# Patient Record
Sex: Female | Born: 1955 | Hispanic: Yes | Marital: Married | Smoking: Never smoker
Health system: Southern US, Community
[De-identification: ages and names within clinical notes are randomized; demographics above are authoritative.]

## PROBLEM LIST (undated history)

## (undated) DIAGNOSIS — E785 Hyperlipidemia, unspecified: Secondary | ICD-10-CM

## (undated) DIAGNOSIS — I10 Essential (primary) hypertension: Secondary | ICD-10-CM

## (undated) DIAGNOSIS — E119 Type 2 diabetes mellitus without complications: Secondary | ICD-10-CM

## (undated) HISTORY — DX: Type 2 diabetes mellitus without complications: E11.9

## (undated) HISTORY — PX: EYE SURGERY: SHX253

## (undated) HISTORY — DX: Hyperlipidemia, unspecified: E78.5

## (undated) HISTORY — DX: Essential (primary) hypertension: I10

---

## 2004-03-19 ENCOUNTER — Ambulatory Visit: Payer: Self-pay | Admitting: Family Medicine

## 2004-04-19 ENCOUNTER — Ambulatory Visit: Payer: Self-pay | Admitting: Family Medicine

## 2004-08-17 ENCOUNTER — Ambulatory Visit: Payer: Self-pay | Admitting: Family Medicine

## 2006-12-26 ENCOUNTER — Other Ambulatory Visit: Payer: Self-pay

## 2006-12-26 ENCOUNTER — Ambulatory Visit: Payer: Self-pay | Admitting: Ophthalmology

## 2007-01-02 ENCOUNTER — Ambulatory Visit: Payer: Self-pay | Admitting: Ophthalmology

## 2009-03-14 ENCOUNTER — Emergency Department: Payer: Self-pay | Admitting: Internal Medicine

## 2009-03-29 ENCOUNTER — Ambulatory Visit: Payer: Self-pay | Admitting: Internal Medicine

## 2010-06-26 ENCOUNTER — Emergency Department: Payer: Self-pay | Admitting: Emergency Medicine

## 2013-01-15 ENCOUNTER — Emergency Department: Payer: Self-pay | Admitting: Emergency Medicine

## 2013-01-16 LAB — COMPREHENSIVE METABOLIC PANEL
Albumin: 3.6 g/dL (ref 3.4–5.0)
Alkaline Phosphatase: 157 U/L — ABNORMAL HIGH (ref 50–136)
BUN: 16 mg/dL (ref 7–18)
Calcium, Total: 9.1 mg/dL (ref 8.5–10.1)
Chloride: 94 mmol/L — ABNORMAL LOW (ref 98–107)
Co2: 35 mmol/L — ABNORMAL HIGH (ref 21–32)
Creatinine: 1.02 mg/dL (ref 0.60–1.30)
EGFR (African American): >60
Glucose: 176 mg/dL — ABNORMAL HIGH (ref 65–99)
Osmolality: 277 (ref 275–301)
Potassium: 3.7 mmol/L (ref 3.5–5.1)
SGOT(AST): 36 U/L (ref 15–37)
Total Protein: 8.3 g/dL — ABNORMAL HIGH (ref 6.4–8.2)

## 2013-01-16 LAB — URINALYSIS, COMPLETE
Bilirubin,UR: NEGATIVE
Glucose,UR: 50 mg/dL (ref 0–75)
Nitrite: NEGATIVE
Ph: 8 (ref 4.5–8.0)
RBC,UR: 2 /HPF (ref 0–5)
Specific Gravity: 1.011 (ref 1.003–1.030)
Squamous Epithelial: 16
WBC UR: 45 /HPF (ref 0–5)

## 2013-01-16 LAB — CBC
HGB: 12.4 g/dL (ref 12.0–16.0)
MCHC: 33.1 g/dL (ref 32.0–36.0)
MCV: 85 fL (ref 80–100)
Platelet: 265 10*3/uL (ref 150–440)
RBC: 4.42 10*6/uL (ref 3.80–5.20)
RDW: 13.5 % (ref 11.5–14.5)

## 2014-09-27 ENCOUNTER — Emergency Department
Admit: 2014-09-27 | Disposition: A | Discharge status: Home / Self Care | Payer: Self-pay | Admitting: Emergency Medicine

## 2019-02-09 ENCOUNTER — Other Ambulatory Visit: Payer: Self-pay

## 2019-02-09 ENCOUNTER — Observation Stay
Admission: EM | Admit: 2019-02-09 | Discharge: 2019-02-10 | Disposition: A | Discharge status: Home / Self Care | Payer: Self-pay | Attending: Internal Medicine | Admitting: Internal Medicine

## 2019-02-09 DIAGNOSIS — N183 Chronic kidney disease, stage 3 (moderate): Secondary | ICD-10-CM | POA: Insufficient documentation

## 2019-02-09 DIAGNOSIS — D631 Anemia in chronic kidney disease: Secondary | ICD-10-CM | POA: Insufficient documentation

## 2019-02-09 DIAGNOSIS — Z7982 Long term (current) use of aspirin: Secondary | ICD-10-CM | POA: Insufficient documentation

## 2019-02-09 DIAGNOSIS — Z79899 Other long term (current) drug therapy: Secondary | ICD-10-CM | POA: Insufficient documentation

## 2019-02-09 DIAGNOSIS — R112 Nausea with vomiting, unspecified: Secondary | ICD-10-CM | POA: Diagnosis present

## 2019-02-09 DIAGNOSIS — U071 COVID-19: Secondary | ICD-10-CM | POA: Diagnosis present

## 2019-02-09 DIAGNOSIS — E1122 Type 2 diabetes mellitus with diabetic chronic kidney disease: Secondary | ICD-10-CM | POA: Insufficient documentation

## 2019-02-09 DIAGNOSIS — Z794 Long term (current) use of insulin: Secondary | ICD-10-CM | POA: Insufficient documentation

## 2019-02-09 DIAGNOSIS — D509 Iron deficiency anemia, unspecified: Secondary | ICD-10-CM | POA: Insufficient documentation

## 2019-02-09 DIAGNOSIS — N179 Acute kidney failure, unspecified: Secondary | ICD-10-CM | POA: Insufficient documentation

## 2019-02-09 DIAGNOSIS — I129 Hypertensive chronic kidney disease with stage 1 through stage 4 chronic kidney disease, or unspecified chronic kidney disease: Secondary | ICD-10-CM | POA: Insufficient documentation

## 2019-02-09 DIAGNOSIS — E860 Dehydration: Principal | ICD-10-CM | POA: Insufficient documentation

## 2019-02-09 DIAGNOSIS — G4700 Insomnia, unspecified: Secondary | ICD-10-CM | POA: Insufficient documentation

## 2019-02-09 LAB — URINALYSIS, COMPLETE (UACMP) WITH MICROSCOPIC
Bacteria, UA: NONE SEEN
Bilirubin Urine: NEGATIVE
Glucose, UA: NEGATIVE mg/dL
Hgb urine dipstick: NEGATIVE
Ketones, ur: NEGATIVE mg/dL
Leukocytes,Ua: NEGATIVE
Nitrite: NEGATIVE
Protein, ur: 100 mg/dL — AB
Specific Gravity, Urine: 1.006 (ref 1.005–1.030)
pH: 7 (ref 5.0–8.0)

## 2019-02-09 LAB — CBC
HCT: 33.3 % — ABNORMAL LOW (ref 36.0–46.0)
Hemoglobin: 11 g/dL — ABNORMAL LOW (ref 12.0–15.0)
MCH: 29.6 pg (ref 26.0–34.0)
MCHC: 33 g/dL (ref 30.0–36.0)
MCV: 89.5 fL (ref 80.0–100.0)
Platelets: 219 10*3/uL (ref 150–400)
RBC: 3.72 MIL/uL — ABNORMAL LOW (ref 3.87–5.11)
RDW: 11.9 % (ref 11.5–15.5)
WBC: 6.3 10*3/uL (ref 4.0–10.5)
nRBC: 0 % (ref 0.0–0.2)

## 2019-02-09 LAB — COMPREHENSIVE METABOLIC PANEL
ALT: 13 U/L (ref 0–44)
AST: 26 U/L (ref 15–41)
Albumin: 3.8 g/dL (ref 3.5–5.0)
Alkaline Phosphatase: 91 U/L (ref 38–126)
Anion gap: 10 (ref 5–15)
BUN: 26 mg/dL — ABNORMAL HIGH (ref 8–23)
CO2: 25 mmol/L (ref 22–32)
Calcium: 8.5 mg/dL — ABNORMAL LOW (ref 8.9–10.3)
Chloride: 100 mmol/L (ref 98–111)
Creatinine, Ser: 2.31 mg/dL — ABNORMAL HIGH (ref 0.44–1.00)
GFR calc Af Amer: 26 mL/min — ABNORMAL LOW (ref >60)
GFR calc non Af Amer: 22 mL/min — ABNORMAL LOW (ref >60)
Glucose, Bld: 147 mg/dL — ABNORMAL HIGH (ref 70–99)
Potassium: 4.6 mmol/L (ref 3.5–5.1)
Sodium: 135 mmol/L (ref 135–145)
Total Bilirubin: 0.3 mg/dL (ref 0.3–1.2)
Total Protein: 7.1 g/dL (ref 6.5–8.1)

## 2019-02-09 LAB — SARS CORONAVIRUS 2 (TAT 6-24 HRS): SARS Coronavirus 2: POSITIVE — AB

## 2019-02-09 LAB — GLUCOSE, CAPILLARY: Glucose-Capillary: 99 mg/dL (ref 70–99)

## 2019-02-09 LAB — HEMOGLOBIN A1C
Hgb A1c MFr Bld: 6.2 % — ABNORMAL HIGH (ref 4.8–5.6)
Mean Plasma Glucose: 131.24 mg/dL

## 2019-02-09 LAB — LIPASE, BLOOD: Lipase: 59 U/L — ABNORMAL HIGH (ref 11–51)

## 2019-02-09 MED ORDER — ACETAMINOPHEN 650 MG RE SUPP: 650.0000 mg | Freq: Four times a day (QID) | RECTAL | Status: DC | PRN

## 2019-02-09 MED ORDER — ASPIRIN EC 81 MG PO TBEC
81.0000 mg | DELAYED_RELEASE_TABLET | Freq: Every day | ORAL | Status: DC
  Administered 2019-02-09 – 2019-02-10 (×2): 81 mg via ORAL
  Filled 2019-02-09 (×2): qty 1

## 2019-02-09 MED ORDER — ACETAMINOPHEN 325 MG PO TABS: 650.0000 mg | ORAL_TABLET | Freq: Four times a day (QID) | ORAL | Status: DC | PRN

## 2019-02-09 MED ORDER — ATENOLOL 100 MG PO TABS
100.0000 mg | ORAL_TABLET | Freq: Every day | ORAL | Status: DC
  Administered 2019-02-09 – 2019-02-10 (×2): 100 mg via ORAL
  Filled 2019-02-09 (×3): qty 1

## 2019-02-09 MED ORDER — INSULIN ASPART 100 UNIT/ML ~~LOC~~ SOLN: 0.0000 [IU] | Freq: Every day | SUBCUTANEOUS | Status: DC

## 2019-02-09 MED ORDER — ENOXAPARIN SODIUM 30 MG/0.3ML ~~LOC~~ SOLN
30.0000 mg | SUBCUTANEOUS | Status: DC
  Administered 2019-02-09: 30 mg via SUBCUTANEOUS
  Filled 2019-02-09: qty 0.3

## 2019-02-09 MED ORDER — HYDRALAZINE HCL 20 MG/ML IJ SOLN: 5.0000 mg | INTRAMUSCULAR | Status: DC | PRN

## 2019-02-09 MED ORDER — ONDANSETRON HCL 4 MG PO TABS: 4.0000 mg | ORAL_TABLET | Freq: Four times a day (QID) | ORAL | Status: DC | PRN

## 2019-02-09 MED ORDER — SODIUM CHLORIDE 0.9 % IV BOLUS
1000.0000 mL | Freq: Once | INTRAVENOUS | Status: AC
  Administered 2019-02-09: 1000 mL via INTRAVENOUS

## 2019-02-09 MED ORDER — AMLODIPINE BESYLATE 10 MG PO TABS
10.0000 mg | ORAL_TABLET | Freq: Every day | ORAL | Status: DC
  Administered 2019-02-09 – 2019-02-10 (×2): 10 mg via ORAL
  Filled 2019-02-09 (×2): qty 1

## 2019-02-09 MED ORDER — ONDANSETRON HCL 4 MG/2ML IJ SOLN: 4.0000 mg | Freq: Four times a day (QID) | INTRAMUSCULAR | Status: DC | PRN

## 2019-02-09 MED ORDER — SODIUM CHLORIDE 0.9 % IV SOLN
INTRAVENOUS | Status: DC
  Administered 2019-02-09 – 2019-02-10 (×2): via INTRAVENOUS

## 2019-02-09 MED ORDER — TRAZODONE HCL 50 MG PO TABS
50.0000 mg | ORAL_TABLET | Freq: Every day | ORAL | Status: DC
  Administered 2019-02-09: 50 mg via ORAL
  Filled 2019-02-09: qty 1

## 2019-02-09 MED ORDER — SODIUM CHLORIDE 0.9% FLUSH: 3.0000 mL | Freq: Once | INTRAVENOUS | Status: DC

## 2019-02-09 MED ORDER — POLYETHYLENE GLYCOL 3350 17 G PO PACK: 17.0000 g | PACK | Freq: Every day | ORAL | Status: DC | PRN

## 2019-02-09 MED ORDER — INSULIN ASPART 100 UNIT/ML ~~LOC~~ SOLN
0.0000 [IU] | Freq: Three times a day (TID) | SUBCUTANEOUS | Status: DC
  Filled 2019-02-09: qty 1

## 2019-02-09 MED ORDER — PROMETHAZINE HCL 25 MG/ML IJ SOLN: 12.5000 mg | Freq: Four times a day (QID) | INTRAMUSCULAR | Status: DC | PRN

## 2019-02-09 MED ORDER — HYDRALAZINE HCL 25 MG PO TABS
25.0000 mg | ORAL_TABLET | Freq: Two times a day (BID) | ORAL | Status: DC
  Administered 2019-02-09 – 2019-02-10 (×2): 25 mg via ORAL
  Filled 2019-02-09 (×2): qty 1

## 2019-02-09 MED ORDER — SODIUM CHLORIDE 0.9 % IV SOLN
Freq: Once | INTRAVENOUS | Status: AC
  Administered 2019-02-09: 17:00:00 via INTRAVENOUS

## 2019-02-09 NOTE — ED Notes (Signed)
ED TO INPATIENT HANDOFF REPORT  ED Nurse Name and Phone #: Metta Clines [phone]  S Name/Age/Gender Stephanie Casey 63 y.o. female Room/Bed: ED16A/ED16A  Code Status   Code Status: Not on file  Home/SNF/Other Home Patient oriented to: self, place, time and situation Is this baseline? Yes   Triage Complete: Triage complete  Chief Complaint weak vomiting 3-4 days  Triage Note Pt to ED with c/o of weakness. Pt with hx of low iron and should receive iron infusions every 6 mos but has not had one in over a year. Pt also prescribed "iron medication" but patient ran out and has not taken. Pt also with c/o of NVD for 3 days.    Allergies Allergies  Allergen Reactions  . Hydrochlorothiazide     Pancreatitis    Level of Care/Admitting Diagnosis ED Disposition    ED Disposition Condition [facility] Area: [place] [[id number]]  Level of Care: Med-Surg [16]  Covid Evaluation: Asymptomatic Screening Protocol (No Symptoms)  Diagnosis: Intractable nausea and vomiting [id number]  Admitting Physician: Hyman Bible DODD [id number]  Attending Physician: Hyman Bible DODD [id number]  Estimated length of stay: past midnight tomorrow  Certification:: I certify this patient will need inpatient services for at least 2 midnights  PT Class (Do Not Modify): Inpatient [101]  PT Acc Code (Do Not Modify): Private [1]       B Medical/Surgery History No past medical history on file.    A IV Location/Drains/Wounds Patient Lines/Drains/Airways Status   Active Line/Drains/Airways    Name:   Placement date:   Placement time:   Site:   Days:   Peripheral IV 02/09/19 Left Antecubital   02/09/19    1547    Antecubital   less than 1          Intake/Output Last 24 hours No intake or output data in the 24 hours ending 02/09/19 1718  Labs/Imaging Results for orders placed or performed during the hospital encounter of 02/09/19 (from the past 48 hour(s))  Lipase,  blood     Status: Abnormal   Collection Time: 02/09/19  1:47 PM  Result Value Ref Range   Lipase 59 (H) 11 - 51 U/L    Comment: Performed at [facility], [address] [place], [place] [address]  Comprehensive metabolic panel     Status: Abnormal   Collection Time: 02/09/19  1:47 PM  Result Value Ref Range   Sodium 135 135 - 145 mmol/L   Potassium 4.6 3.5 - 5.1 mmol/L   Chloride 100 98 - 111 mmol/L   CO2 25 22 - 32 mmol/L   Glucose, Bld 147 (H) 70 - 99 mg/dL   BUN 26 (H) 8 - 23 mg/dL   Creatinine, Ser 2.31 (H) 0.44 - 1.00 mg/dL   Calcium 8.5 (L) 8.9 - 10.3 mg/dL   Total Protein 7.1 6.5 - 8.1 g/dL   Albumin 3.8 3.5 - 5.0 g/dL   AST 26 15 - 41 U/L   ALT 13 0 - 44 U/L   Alkaline Phosphatase 91 38 - 126 U/L   Total Bilirubin 0.3 0.3 - 1.2 mg/dL   GFR calc non Af Amer 22 (L) >60 mL/min   GFR calc Af Amer 26 (L) >60 mL/min   Anion gap 10 5 - 15    Comment: Performed at [facility], [address] [place],  [address]  CBC     Status: Abnormal   Collection Time: 02/09/19  1:47 PM  Result Value Ref Range   WBC 6.3 4.0 - 10.5 K/uL   RBC 3.72 (L) 3.87 - 5.11 MIL/uL   Hemoglobin 11.0 (L) 12.0 - 15.0 g/dL   HCT 33.3 (L) 36.0 - 46.0 %   MCV 89.5 80.0 - 100.0 fL   MCH 29.6 26.0 - 34.0 pg   MCHC 33.0 30.0 - 36.0 g/dL   RDW 11.9 11.5 - 15.5 %   Platelets 219 150 - 400 K/uL   nRBC 0.0 0.0 - 0.2 %    Comment: Performed at [facility], [address], [place], [place] [address]   No results found.  Pending Labs Unresulted Labs (From admission, onward)    Start     Ordered   02/09/19 1351  Urinalysis, Complete w Microscopic  ONCE - STAT,   STAT     02/09/19 1350   Signed and Held  HIV antibody (Routine Testing)  Once,   R     Signed and Held   Signed and Occupational hygienist morning,   R     Signed and Held   Signed and Held  CBC  Tomorrow morning,   R     Signed and Held   Signed and Held  Gastrointestinal Panel by PCR ,  Stool  (Gastrointestinal Panel by PCR, Stool)  Once,   R     Signed and Held   Signed and Held  C Difficile Quick Screen w PCR reflex  (Gastrointestinal Panel by PCR, Stool)  Once, for 24 hours,   R     Signed and Held   Signed and Held  Hemoglobin A1c  Once,   R    Comments: To assess prior glycemic control    Signed and Held   Signed and Held  Hemoglobin A1c  Tomorrow morning,   R     Signed and Held          Vitals/Pain Today's Vitals   02/09/19 1630 02/09/19 1700 02/09/19 1709 02/09/19 1715  BP: (!) 166/70 (!) 158/75    Pulse: 73 74    Resp:      Temp:      TempSrc:      SpO2: 99% 96%  98%  PainSc:   0-No pain     Isolation Precautions No active isolations  Medications Medications  sodium chloride flush (NS) 0.9 % injection 3 mL (has no administration in time range)  0.9 %  sodium chloride infusion ( Intravenous New Bag/Given 02/09/19 1713)  sodium chloride 0.9 % bolus 1,000 mL (0 mLs Intravenous Stopped 02/09/19 1702)    Mobility walks with device PRN Low fall risk   Focused Assessments Weakness; NVD x3days; A&Ox4; NSR   R Recommendations: See Admitting Provider Note  Report given to:   Additional Notes:  20g L ac IV

## 2019-02-09 NOTE — H&P (Signed)
[place] [facility] [place] NAME: Stephanie Casey    MR#:  [id number]  DATE OF BIRTH:  [birth year]  DATE OF ADMISSION:  02/09/2019  PRIMARY CARE PHYSICIAN: Stephanie Finner, NP   REQUESTING/REFERRING PHYSICIAN: Duffy Bruce, MD  CHIEF COMPLAINT:   Chief Complaint  Patient presents with  . Weakness    HISTORY OF PRESENT ILLNESS:  Stephanie Casey  is a 63 y.o. female with a known history of CKD III, hypertension, type 2 diabetes, anemia of chronic kidney disease who presented to the ED with nausea, vomiting, and diarrhea over the last 3 days.  Patient states that she has vomited about 3 times a day and had multiple episodes of diarrhea per day.  She has also had associated poor p.o. intake.  She denies any abdominal pain.  No sick contacts.  No fevers or shortness of breath.  She endorses mild chills.  No hematemesis, melena, or hematochezia.  In the ED, she was hypertensive with BP 171/72.  Labs are significant for creatinine 2.31, lipase 59, hemoglobin 11.0.  Hospitalists were called for admission.  PAST MEDICAL HISTORY:  Hypertension Type 2 diabetes CKD III Anemia of chronic kidney disease  PAST SURGICAL HISTORY:  None  SOCIAL HISTORY:   Social History   Tobacco Use  . Smoking status: Not on file  Substance Use Topics  . Alcohol use: Not on file    FAMILY HISTORY:  No family history on file.  DRUG ALLERGIES:   Allergies  Allergen Reactions  . Hydrochlorothiazide     Pancreatitis    REVIEW OF SYSTEMS:   Review of Systems  Constitutional: Positive for chills. Negative for fever.  HENT: Negative for congestion and sore throat.   Eyes: Negative for blurred vision and double vision.  Respiratory: Negative for cough and shortness of breath.   Cardiovascular: Negative for chest pain and palpitations.  Gastrointestinal: Positive for diarrhea, nausea and vomiting. Negative for abdominal pain, blood in stool,  constipation and melena.  Genitourinary: Negative for dysuria and urgency.  Musculoskeletal: Negative for back pain and neck pain.  Neurological: Negative for dizziness and headaches.  Psychiatric/Behavioral: Negative for depression. The patient is not nervous/anxious.     MEDICATIONS AT HOME:   Prior to Admission medications   Medication Sig Start Date End Date Taking? Authorizing Provider  amLODipine (NORVASC) 10 MG tablet Take 10 mg by mouth daily.   Yes [provider]  aspirin EC 81 MG tablet Take 81 mg by mouth daily.   Yes [provider]  atenolol (TENORMIN) 100 MG tablet Take 100 mg by mouth daily.   Yes [provider]  hydrALAZINE (APRESOLINE) 25 MG tablet Take 25 mg by mouth 2 (two) times daily.   Yes [provider]  metFORMIN (GLUCOPHAGE-XR) 500 MG 24 hr tablet Take 500 mg by mouth 2 (two) times daily with a meal.   Yes [provider]  traZODone (DESYREL) 50 MG tablet Take 50-100 mg by mouth at bedtime.   Yes [provider]  insulin NPH Human (NOVOLIN N) 100 UNIT/ML injection Inject 25 Units into the skin 2 (two) times daily.    [provider]      VITAL SIGNS:  Blood pressure (!) 165/71, pulse 69, temperature 99 F (37.2 C), temperature source Oral, resp. rate 16, SpO2 100 %.  PHYSICAL EXAMINATION:  Physical Exam  GENERAL:  63 y.o.-year-old patient lying in the bed with no acute distress.  EYES: Pupils  equal, round, reactive to light and accommodation. No scleral icterus. Extraocular muscles intact.  HEENT: Head atraumatic, normocephalic. Oropharynx and nasopharynx clear.  NECK:  Supple, no jugular venous distention. No thyroid enlargement, no tenderness.  LUNGS: Normal breath sounds bilaterally, no wheezing, rales,rhonchi or crepitation. No use of accessory muscles of respiration.  CARDIOVASCULAR: RRR, S1, S2 normal. No murmurs, rubs, or gallops.  ABDOMEN: Soft, nondistended. Bowel sounds present. No  organomegaly or mass. + Mild tenderness to palpation of the left lower quadrant.  No rebound or guarding.  Murphy sign negative.  No tenderness over McBurney's point. EXTREMITIES: No pedal edema, cyanosis, or clubbing.  NEUROLOGIC: Cranial nerves II through XII are intact. Muscle strength 5/5 in all extremities. Sensation intact. Gait not checked.  PSYCHIATRIC: The patient is alert and oriented x 3.  SKIN: No obvious rash, lesion, or ulcer.   LABORATORY PANEL:   CBC Recent Labs  Lab 02/09/19 1347  WBC 6.3  HGB 11.0*  HCT 33.3*  PLT 219   ------------------------------------------------------------------------------------------------------------------  Chemistries  Recent Labs  Lab 02/09/19 1347  NA 135  K 4.6  CL 100  CO2 25  GLUCOSE 147*  BUN 26*  CREATININE 2.31*  CALCIUM 8.5*  AST 26  ALT 13  ALKPHOS 91  BILITOT 0.3   ------------------------------------------------------------------------------------------------------------------  Cardiac Enzymes No results for input(s): TROPONINI in the last 168 hours. ------------------------------------------------------------------------------------------------------------------  RADIOLOGY:  No results found.    IMPRESSION AND PLAN:   Nausea/vomiting/diarrhea- likely viral gastroenteritis. Lipase is mildly elevated but patient is not having any epigastric abdominal pain. -GI pathogen panel and C. Difficile -IV fluids and IV antiemetics -If no improvement in symptoms, would consider obtaining CT abdomen/pelvis -Start clear liquid diet and advance as tolerated  AKI in CKD III-creatinine 2.31 in the ED.  Most recent outpatient creatinine was 1.46 in 07/2018. -IV fluids -Avoid nephrotoxic agents  Hypertension- BP elevated in the ED -Continue home atenolol, norvasc, hydralazine  Type 2 diabetes- blood sugars elevated in the ED -Sensitive SSI -Check A1c  Insomnia- stable -Continue home trazodone  All the records  are reviewed and case discussed with ED provider. Management plans discussed with the patient, family and they are in agreement.  CODE STATUS: full  TOTAL TIME TAKING CARE OF THIS PATIENT: 45 minutes.    Stephanie Casey M.D on 02/09/2019 at 4:10 PM  Between 7am to 6pm - Pager [phone]  After 6pm go to [email or web address] - Proofreader  [facility]  Office  [phone]  CC: Primary care physician; Stephanie Finner, NP   Note: This dictation was prepared with Dragon dictation along with smaller phrase technology. Any transcriptional errors that result from this process are unintentional.

## 2019-02-09 NOTE — ED Triage Notes (Signed)
Pt to ED with c/o of weakness. Pt with hx of low iron and should receive iron infusions every 6 mos but has not had one in over a year. Pt also prescribed "iron medication" but patient ran out and has not taken. Pt also with c/o of NVD for 3 days.

## 2019-02-09 NOTE — ED Notes (Signed)
Pt resting comfortably in bed

## 2019-02-09 NOTE — ED Notes (Signed)
Pt A&Ox4. C/o weakness x2 weeks; NVD x3 days; family member at bedside; states pain in medial/upper abdomen intermittent. [employer] interpreter system used. Pt agreeable to IV and fluids. EMS student attempting for IV now.

## 2019-02-09 NOTE — ED Notes (Signed)
Request made for aptima/covid test.

## 2019-02-09 NOTE — ED Notes (Addendum)
EMS student attempting for IV at L arm as IV wouldn't thread at R wrist. Pt understands that urine sample is needed and agrees to let this RN know when she can provide a sample.

## 2019-02-09 NOTE — ED Notes (Addendum)
Grn tube sent to lab in case of order for Trop (x2).

## 2019-02-09 NOTE — ED Notes (Signed)
Pt denies nausea.

## 2019-02-09 NOTE — ED Provider Notes (Signed)
[facility] Emergency Department Provider Note  ____________________________________________   First MD Initiated Contact with Patient 02/09/19 1439     (approximate)  I have reviewed the triage vital signs and the nursing notes.   HISTORY  Chief Complaint Weakness    HPI Stephanie Casey is a 63 y.o. female with past medical history of chronic kidney disease here with multiple complaints.  Patient's primary complaint is progressively worsening generalized weakness.  She states that for the last several weeks, she has felt incredibly weak and fatigued.  She has had very poor appetite and believes she is lost several pounds.  She states that she just does not feel like eating.  She states that she has been increasingly weak and lightheaded upon standing.  She is had normal urine output.  No dysuria.  She states that over the last several days, she has had intermittent abdominal cramping and nausea, vomiting, and diarrhea.  No blood in her diarrhea or vomit.  No persistent abdominal pain.  No fevers or chills.  No cough.  No other medical complaints.  Try to call her doctor but was only arranged with a telephone visit, so subsequently presents for evaluation.  She has not had any recent lab work done.        No past medical history on file.   PMHx:  Anemia, iron deficiency CKD  PSHx: noncontributory  Patient Active Problem List   Diagnosis Date Noted  . Intractable nausea and vomiting 02/09/2019     Prior to Admission medications   Medication Sig Start Date End Date Taking? Authorizing Provider  amLODipine (NORVASC) 10 MG tablet Take 10 mg by mouth daily.   Yes [provider]  aspirin EC 81 MG tablet Take 81 mg by mouth daily.   Yes [provider]  atenolol (TENORMIN) 100 MG tablet Take 100 mg by mouth daily.   Yes [provider]  hydrALAZINE (APRESOLINE) 25 MG tablet Take 25 mg by mouth 2 (two) times daily.   Yes  [provider]  metFORMIN (GLUCOPHAGE-XR) 500 MG 24 hr tablet Take 500 mg by mouth 2 (two) times daily with a meal.   Yes [provider]  traZODone (DESYREL) 50 MG tablet Take 50-100 mg by mouth at bedtime.   Yes [provider]  insulin NPH Human (NOVOLIN N) 100 UNIT/ML injection Inject 25 Units into the skin 2 (two) times daily.    [provider]    Allergies Hydrochlorothiazide  No family history on file.  Social History Social History   Tobacco Use  . Smoking status: Not on file  Substance Use Topics  . Alcohol use: Not on file  . Drug use: Not on file    Review of Systems  Review of Systems  Constitutional: Positive for fatigue. Negative for fever.  HENT: Negative for congestion and sore throat.   Eyes: Negative for visual disturbance.  Respiratory: Negative for cough and shortness of breath.   Cardiovascular: Negative for chest pain.  Gastrointestinal: Positive for diarrhea, nausea and vomiting. Negative for abdominal pain.  Genitourinary: Negative for flank pain.  Musculoskeletal: Negative for back pain and neck pain.  Skin: Negative for rash and wound.  Neurological: Positive for weakness.  All other systems reviewed and are negative.    ____________________________________________  PHYSICAL EXAM:      VITAL SIGNS: ED Triage Vitals  Enc Vitals Group     BP 02/09/19 1338 (!) 132/58     Pulse  Rate 02/09/19 1338 67     Resp 02/09/19 1338 16     Temp 02/09/19 1338 99 F (37.2 C)     Temp Source 02/09/19 1338 Oral     SpO2 02/09/19 1338 97 %     Weight --      Height --      Head Circumference --      Peak Flow --      Pain Score 02/09/19 1342 0     Pain Loc --      Pain Edu? --      Excl. in [place]? --      Physical Exam Vitals signs and nursing note reviewed.  Constitutional:      General: She is not in acute distress.    Appearance: She is well-developed.  HENT:     Head: Normocephalic and atraumatic.      Mouth/Throat:     Mouth: Mucous membranes are dry.  Eyes:     Conjunctiva/sclera: Conjunctivae normal.  Neck:     Musculoskeletal: Neck supple.  Cardiovascular:     Rate and Rhythm: Normal rate and regular rhythm.     Heart sounds: Normal heart sounds. No murmur. No friction rub.  Pulmonary:     Effort: Pulmonary effort is normal. No respiratory distress.     Breath sounds: Normal breath sounds. No wheezing or rales.  Abdominal:     General: There is no distension.     Palpations: Abdomen is soft.     Tenderness: There is no abdominal tenderness.  Skin:    General: Skin is warm.     Capillary Refill: Capillary refill takes less than 2 seconds.  Neurological:     Mental Status: She is alert and oriented to person, place, and time.     Motor: No abnormal muscle tone.       ____________________________________________   LABS (all labs ordered are listed, but only abnormal results are displayed)  Labs Reviewed  LIPASE, BLOOD - Abnormal; Notable for the following components:      Result Value   Lipase 59 (*)    All other components within normal limits  COMPREHENSIVE METABOLIC PANEL - Abnormal; Notable for the following components:   Glucose, Bld 147 (*)    BUN 26 (*)    Creatinine, Ser 2.31 (*)    Calcium 8.5 (*)    GFR calc non Af Amer 22 (*)    GFR calc Af Amer 26 (*)    All other components within normal limits  CBC - Abnormal; Notable for the following components:   RBC 3.72 (*)    Hemoglobin 11.0 (*)    HCT 33.3 (*)    All other components within normal limits  URINALYSIS, COMPLETE (UACMP) WITH MICROSCOPIC - Abnormal; Notable for the following components:   Color, Urine STRAW (*)    APPearance CLEAR (*)    Protein, ur 100 (*)    All other components within normal limits  SARS CORONAVIRUS 2    ____________________________________________  EKG: None ________________________________________  RADIOLOGY All imaging, including plain films, CT scans, and  ultrasounds, independently reviewed by me, and interpretations confirmed via formal radiology reads.  ED MD interpretation:   None  Official radiology report(s): No results found.  ____________________________________________  PROCEDURES   Procedure(s) performed (including Critical Care):  Procedures  ____________________________________________  INITIAL IMPRESSION / MDM / [place] / ED COURSE  As part of my medical decision making, I reviewed the following data within the  electronic MEDICAL RECORD NUMBER Notes from prior ED visits and [place] Controlled Substance Database      *Stephanie Casey was evaluated in Emergency Department on 02/09/2019 for the symptoms described in the history of present illness. She was evaluated in the context of the global COVID-19 pandemic, which necessitated consideration that the patient might be at risk for infection with the SARS-CoV-2 virus that causes COVID-19. Institutional protocols and algorithms that pertain to the evaluation of patients at risk for COVID-19 are in a state of rapid change based on information released by regulatory bodies including the CDC and federal and state organizations. These policies and algorithms were followed during the patient's care in the ED.  Some ED evaluations and interventions may be delayed as a result of limited staffing during the pandemic.*      Medical Decision Making: 63 year old female here with acute on chronic kidney injury likely due to significant dehydration.  Patient also with baseline chronic anemia.  Urinalysis unremarkable.  She has some nausea, vomiting, diarrhea, which could be secondary to viral illness, gastroenteritis, malnutrition, but do not suspect acute intra-abdominal emergency with absence of any abdominal tenderness on serial exams.  Do not feel CT imaging would be beneficial at this time, especially in the absence of contrast given her kidney injury.  Will plan to admit for  gentle hydration and further monitoring.  ____________________________________________  FINAL CLINICAL IMPRESSION(S) / ED DIAGNOSES  Final diagnoses:  AKI (acute kidney injury) ([place])  Dehydration     MEDICATIONS GIVEN DURING THIS VISIT:  Medications  sodium chloride flush (NS) 0.9 % injection 3 mL (has no administration in time range)  0.9 %  sodium chloride infusion ( Intravenous New Bag/Given 02/09/19 1713)  sodium chloride 0.9 % bolus 1,000 mL (0 mLs Intravenous Stopped 02/09/19 1702)     ED Discharge Orders    None       Note:  This document was prepared using Dragon voice recognition software and may include unintentional dictation errors.   Duffy Bruce, MD 02/09/19 [phone]

## 2019-02-10 ENCOUNTER — Other Ambulatory Visit: Payer: Self-pay

## 2019-02-10 DIAGNOSIS — U071 COVID-19: Secondary | ICD-10-CM | POA: Diagnosis present

## 2019-02-10 LAB — CBC
HCT: 31.4 % — ABNORMAL LOW (ref 36.0–46.0)
Hemoglobin: 10.4 g/dL — ABNORMAL LOW (ref 12.0–15.0)
MCH: 29.8 pg (ref 26.0–34.0)
MCHC: 33.1 g/dL (ref 30.0–36.0)
MCV: 90 fL (ref 80.0–100.0)
Platelets: 186 10*3/uL (ref 150–400)
RBC: 3.49 MIL/uL — ABNORMAL LOW (ref 3.87–5.11)
RDW: 12.1 % (ref 11.5–15.5)
WBC: 5.2 10*3/uL (ref 4.0–10.5)
nRBC: 0 % (ref 0.0–0.2)

## 2019-02-10 LAB — BASIC METABOLIC PANEL
Anion gap: 10 (ref 5–15)
BUN: 20 mg/dL (ref 8–23)
CO2: 23 mmol/L (ref 22–32)
Calcium: 8.3 mg/dL — ABNORMAL LOW (ref 8.9–10.3)
Chloride: 107 mmol/L (ref 98–111)
Creatinine, Ser: 1.7 mg/dL — ABNORMAL HIGH (ref 0.44–1.00)
GFR calc Af Amer: 37 mL/min — ABNORMAL LOW (ref >60)
GFR calc non Af Amer: 32 mL/min — ABNORMAL LOW (ref >60)
Glucose, Bld: 112 mg/dL — ABNORMAL HIGH (ref 70–99)
Potassium: 4.1 mmol/L (ref 3.5–5.1)
Sodium: 140 mmol/L (ref 135–145)

## 2019-02-10 LAB — GLUCOSE, CAPILLARY: Glucose-Capillary: 78 mg/dL (ref 70–99)

## 2019-02-10 NOTE — Progress Notes (Signed)
The patient is transferred to room 235 . Patient is settled in her room by the help of interpreter  Sharyn Lull  with [phone]. Patient denied any acute pain. Will continue to monitor.

## 2019-02-10 NOTE — Progress Notes (Signed)
Discharge instructions provided to pt.  All questions addressed.  Understanding verified through teach back.  Awaiting transportation home via POV.  

## 2019-02-10 NOTE — Discharge Summary (Signed)
[facility] [place] [facility] [place], [place] y.o., DOB 1955-10-02, MRN [id number]. Admission date: 02/09/2019 Discharge Date 02/10/2019 Primary MD Freddy Finner, NP Admitting Physician Sela Hua, MD  Admission Diagnosis  Dehydration [E86.0] AKI (acute kidney injury) [facility]) [N17.9]  Discharge Diagnosis   Active Problems: Intractable nausea and vomiting COVID-19 Kidney injury on chronic kidney disease stage III Essential hypertension Diabetes type [place]  is a 63 y.o. female with a known history of CKD III, hypertension, type 2 diabetes, anemia of chronic kidney disease who presented to the ED with nausea, vomiting, and diarrhea over the last 3 days.  Patient states that she has vomited about 3 times a day and had multiple episodes of diarrhea per day.  Patient was noted to have positive COVID-19.            Consults  None  Significant Tests:  See full reports for all details     No results found.     Today   Subjective:   Stephanie Casey patient's feeling better nausea vomiting is resolved  Objective:   Blood pressure (!) 150/65, pulse 70, temperature 99.2 F (37.3 C), temperature source Oral, resp. rate 16, height 5\' 5"  (1.651 m), weight 50.4 kg, SpO2 96 %.  .  Intake/Output Summary (Last 24 hours) at 02/10/2019 1557 Last data filed at 02/10/2019 0700 Gross per 24 hour  Intake 280 ml  Output -  Net 280 ml    Exam VITAL SIGNS: Blood pressure (!) 150/65, pulse 70, temperature 99.2 F (37.3 C), temperature source Oral, resp. rate 16, height 5\' 5"  (1.651 m), weight 50.4 kg, SpO2 96 %.  GENERAL:  63 y.o.-year-old patient lying in the bed with no acute distress.  EYES: Pupils equal, round, reactive to light and accommodation. No scleral icterus. Extraocular muscles intact.  HEENT: Head atraumatic, normocephalic. Oropharynx and nasopharynx clear.  NECK:  Supple, no jugular venous  distention. No thyroid enlargement, no tenderness.  LUNGS: Normal breath sounds bilaterally, no wheezing, rales,rhonchi or crepitation. No use of accessory muscles of respiration.  CARDIOVASCULAR: S1, S2 normal. No murmurs, rubs, or gallops.  ABDOMEN: Soft, nontender, nondistended. Bowel sounds present. No organomegaly or mass.  EXTREMITIES: No pedal edema, cyanosis, or clubbing.  NEUROLOGIC: Cranial nerves II through XII are intact. Muscle strength 5/5 in all extremities. Sensation intact. Gait not checked.  PSYCHIATRIC: The patient is alert and oriented x 3.  SKIN: No obvious rash, lesion, or ulcer.   Data Review     CBC w Diff:  Lab Results  Component Value Date   WBC 5.2 02/10/2019   HGB 10.4 (L) 02/10/2019   HGB 12.4 01/15/2013   HCT 31.4 (L) 02/10/2019   HCT 37.6 01/15/2013   PLT 186 02/10/2019   PLT 265 01/15/2013   CMP:  Lab Results  Component Value Date   NA 140 02/10/2019   NA 136 01/15/2013   K 4.1 02/10/2019   K 3.7 01/15/2013   CL 107 02/10/2019   CL 94 (L) 01/15/2013   CO2 23 02/10/2019   CO2 35 (H) 01/15/2013   BUN 20 02/10/2019   BUN 16 01/15/2013   CREATININE 1.70 (H) 02/10/2019   CREATININE 1.02 01/15/2013   PROT 7.1 02/09/2019   PROT 8.3 (H) 01/15/2013   ALBUMIN 3.8 02/09/2019   ALBUMIN 3.6 01/15/2013   BILITOT 0.3 02/09/2019   BILITOT 0.4 01/15/2013   ALKPHOS 91  02/09/2019   ALKPHOS 157 (H) 01/15/2013   AST 26 02/09/2019   AST 36 01/15/2013   ALT 13 02/09/2019   ALT 29 01/15/2013  .  Micro Results Recent Results (from the past 240 hour(s))  SARS CORONAVIRUS 2 Nasal Swab Aptima Multi Swab     Status: Abnormal   Collection Time: 02/09/19  7:24 PM   Specimen: Aptima Multi Swab; Nasal Swab  Result Value Ref Range Status   SARS Coronavirus 2 POSITIVE (A) NEGATIVE Final    Comment: (NOTE) SARS-CoV-2 target nucleic acids are DETECTED. The SARS-CoV-2 RNA is generally detectable in upper and lower respiratory specimens during the acute phase  of infection. Positive results are indicative of active infection with SARS-CoV-2. Clinical  correlation with patient history and other diagnostic information is necessary to determine patient infection status. Positive results do  not rule out bacterial infection or co-infection with other viruses. The expected result is Negative. Fact Sheet for Patients: SugarRoll.be Fact Sheet for Healthcare Providers: https://www.woods-mathews.com/ This test is not yet approved or cleared by the [place] FDA and  has been authorized for detection and/or diagnosis of SARS-CoV-2 by FDA under an Emergency Use Authorization (EUA). This EUA will remain  in effect (meaning this test can be used) for the duration of the COVID-19 declaration under Section 564(b)(1) of the Act, 21 U.S.C.  section 360bbb-3(b)(1), unless the authorization is terminated or revoked sooner. Performed at [facility], [place] [address] [place], [place] [address]         Code Status Orders  (From admission, onward)         Start     Ordered   02/09/19 1836  Full code  Continuous     02/09/19 1835        Code Status History    This patient has a current code status but no historical code status.   Advance Care Planning Activity          Follow-up Information    Freddy Finner, NP. Go on 02/18/2019.   Specialty: Nurse Practitioner Why: virtual appointment at 10:40am Contact information: [place] [address] [phone]           Discharge Medications   Allergies as of 02/10/2019      Reactions   Hydrochlorothiazide    Pancreatitis      Medication List    STOP taking these medications   metFORMIN 500 MG 24 hr tablet Commonly known as: GLUCOPHAGE-XR     TAKE these medications   amLODipine 10 MG tablet Commonly known as: NORVASC Take 10 mg by mouth daily.   aspirin EC 81 MG tablet Take 81 mg by mouth daily.    atenolol 100 MG tablet Commonly known as: TENORMIN Take 100 mg by mouth daily.   hydrALAZINE 25 MG tablet Commonly known as: APRESOLINE Take 25 mg by mouth 2 (two) times daily.   insulin NPH Human 100 UNIT/ML injection Commonly known as: NOVOLIN N Inject 25 Units into the skin 2 (two) times daily.   traZODone 50 MG tablet Commonly known as: DESYREL Take 50-100 mg by mouth at bedtime.          Total Time in preparing paper work, data evaluation and todays exam - 52 minutes  Dustin Flock M.D on 02/10/2019 at 3:57 PM [place]  [phone]

## 2019-02-10 NOTE — Plan of Care (Signed)
Pt ready for discharge home.   Problem: Education: Goal: Knowledge of General Education information will improve Description: Including pain rating scale, medication(s)/side effects and non-pharmacologic comfort measures Outcome: Completed/Met   Problem: Health Behavior/Discharge Planning: Goal: Ability to manage health-related needs will improve Outcome: Completed/Met   Problem: Clinical Measurements: Goal: Ability to maintain clinical measurements within normal limits will improve Outcome: Completed/Met Goal: Will remain free from infection Outcome: Completed/Met Goal: Diagnostic test results will improve Outcome: Completed/Met Goal: Respiratory complications will improve Outcome: Completed/Met Goal: Cardiovascular complication will be avoided Outcome: Completed/Met   Problem: Activity: Goal: Risk for activity intolerance will decrease Outcome: Completed/Met   Problem: Nutrition: Goal: Adequate nutrition will be maintained Outcome: Completed/Met   Problem: Coping: Goal: Level of anxiety will decrease Outcome: Completed/Met   Problem: Elimination: Goal: Will not experience complications related to bowel motility Outcome: Completed/Met Goal: Will not experience complications related to urinary retention Outcome: Completed/Met   Problem: Pain Managment: Goal: General experience of comfort will improve Outcome: Completed/Met   Problem: Safety: Goal: Ability to remain free from injury will improve Outcome: Completed/Met   Problem: Skin Integrity: Goal: Risk for impaired skin integrity will decrease Outcome: Completed/Met   Problem: Inadequate Intake (NI-2.1) Goal: Food and/or nutrient delivery Description: Individualized approach for food/nutrient provision. Outcome: Completed/Met

## 2019-02-10 NOTE — Progress Notes (Signed)
Initial Nutrition Assessment  DOCUMENTATION CODES:   Underweight  INTERVENTION:   Ensure Enlive po BID, each supplement provides 350 kcal and 20 grams of protein  MVI daily   Recommend liberal diet   NUTRITION DIAGNOSIS:   Inadequate oral intake related to acute illness as evidenced by other (comment)(per chart review).  GOAL:   Patient will meet greater than or equal to 90% of their needs  MONITOR:   PO intake, Supplement acceptance, Labs, Weight trends, Skin, I & O's  REASON FOR ASSESSMENT:   Malnutrition Screening Tool    ASSESSMENT:   63 y.o. female with a known history of CKD III, hypertension, type 2 diabetes, anemia of chronic kidney disease admitted with COVID 19  RD working remotely.  Pt with poor appetite and oral intake for 3 days pta r/t nausea and vomiting. Pt advanced to a regular diet today. RD will add supplements and MVI to help pt meet her estimated needs. There is not a very detailed weight history in chart to confirm any significant weight loss but pt appears to be down 9lbs(7%) since February.   Medications reviewed and include: aspirin, lovenox, insulin, NaCl @100ml /hr  Labs reviewed: creat 1.70(H) Hgb 10.4(L), Hct 31.4(L)  Unable to complete Nutrition-Focused physical exam at this time.   Diet Order:   Diet Order            Diet - low sodium heart healthy        Diet clear liquid Room service appropriate? Yes; Fluid consistency: Thin  Diet effective now             EDUCATION NEEDS:   No education needs have been identified at this time  Skin:  Skin Assessment: Reviewed RN Assessment  Last BM:  8/23  Height:   Ht Readings from Last 1 Encounters:  02/09/19 5\' 5"  (1.651 m)    Weight:   Wt Readings from Last 1 Encounters:  02/09/19 50.4 kg    Ideal Body Weight:  56.8 kg  BMI:  Body mass index is 18.49 kg/m.  Estimated Nutritional Needs:   Kcal:  1400-1600kcal/day  Protein:  70-80g/day  Fluid:   >1.4L/day  Koleen Distance MS, RD, LDN Pager #- [phone] Office#- [phone] After Hours Pager: [phone]

## 2019-02-11 LAB — HIV ANTIBODY (ROUTINE TESTING W REFLEX): HIV Screen 4th Generation wRfx: NONREACTIVE

## 2020-02-26 ENCOUNTER — Encounter: Payer: Self-pay | Admitting: *Deleted

## 2020-02-26 ENCOUNTER — Emergency Department
Admission: EM | Admit: 2020-02-26 | Discharge: 2020-02-26 | Disposition: A | Discharge status: Home / Self Care | Payer: No Typology Code available for payment source | Attending: Emergency Medicine | Admitting: Emergency Medicine

## 2020-02-26 ENCOUNTER — Other Ambulatory Visit: Payer: Self-pay

## 2020-02-26 ENCOUNTER — Emergency Department: Payer: No Typology Code available for payment source

## 2020-02-26 DIAGNOSIS — Z79899 Other long term (current) drug therapy: Secondary | ICD-10-CM | POA: Diagnosis not present

## 2020-02-26 DIAGNOSIS — E119 Type 2 diabetes mellitus without complications: Secondary | ICD-10-CM | POA: Insufficient documentation

## 2020-02-26 DIAGNOSIS — M542 Cervicalgia: Secondary | ICD-10-CM | POA: Diagnosis not present

## 2020-02-26 DIAGNOSIS — Z794 Long term (current) use of insulin: Secondary | ICD-10-CM | POA: Insufficient documentation

## 2020-02-26 DIAGNOSIS — I10 Essential (primary) hypertension: Secondary | ICD-10-CM | POA: Insufficient documentation

## 2020-02-26 MED ORDER — ONDANSETRON 4 MG PO TBDP: 4.0000 mg | ORAL_TABLET | Freq: Three times a day (TID) | ORAL | 0 refills | Status: AC | PRN

## 2020-02-26 MED ORDER — ONDANSETRON 4 MG PO TBDP
4.0000 mg | ORAL_TABLET | Freq: Once | ORAL | Status: AC
  Administered 2020-02-26: 4 mg via ORAL
  Filled 2020-02-26: qty 1

## 2020-02-26 MED ORDER — HYDROCODONE-ACETAMINOPHEN 5-325 MG PO TABS
1.0000 | ORAL_TABLET | Freq: Once | ORAL | Status: AC
  Administered 2020-02-26: 1 via ORAL
  Filled 2020-02-26: qty 1

## 2020-02-26 MED ORDER — TRAMADOL HCL 50 MG PO TABS: 50.0000 mg | ORAL_TABLET | Freq: Four times a day (QID) | ORAL | 0 refills | Status: AC | PRN
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
## 2020-02-26 NOTE — ED Notes (Signed)
Via video interpreter Rudell Cobb [phone]

## 2020-06-21 ENCOUNTER — Other Ambulatory Visit: Payer: Self-pay

## 2020-06-21 ENCOUNTER — Inpatient Hospital Stay
Admission: EM | Admit: 2020-06-21 | Discharge: 2020-07-20 | DRG: 291 | Disposition: E | Payer: Medicaid Other | Attending: Internal Medicine | Admitting: Internal Medicine

## 2020-06-21 ENCOUNTER — Inpatient Hospital Stay: Payer: Medicaid Other

## 2020-06-21 ENCOUNTER — Encounter: Payer: Self-pay | Admitting: Emergency Medicine

## 2020-06-21 ENCOUNTER — Emergency Department: Payer: Medicaid Other

## 2020-06-21 DIAGNOSIS — G4733 Obstructive sleep apnea (adult) (pediatric): Secondary | ICD-10-CM | POA: Diagnosis present

## 2020-06-21 DIAGNOSIS — J9601 Acute respiratory failure with hypoxia: Secondary | ICD-10-CM | POA: Diagnosis present

## 2020-06-21 DIAGNOSIS — Z888 Allergy status to other drugs, medicaments and biological substances status: Secondary | ICD-10-CM

## 2020-06-21 DIAGNOSIS — G928 Other toxic encephalopathy: Secondary | ICD-10-CM | POA: Diagnosis not present

## 2020-06-21 DIAGNOSIS — Z4659 Encounter for fitting and adjustment of other gastrointestinal appliance and device: Secondary | ICD-10-CM

## 2020-06-21 DIAGNOSIS — Z01818 Encounter for other preprocedural examination: Secondary | ICD-10-CM

## 2020-06-21 DIAGNOSIS — J9602 Acute respiratory failure with hypercapnia: Secondary | ICD-10-CM | POA: Diagnosis present

## 2020-06-21 DIAGNOSIS — E785 Hyperlipidemia, unspecified: Secondary | ICD-10-CM | POA: Diagnosis present

## 2020-06-21 DIAGNOSIS — Z794 Long term (current) use of insulin: Secondary | ICD-10-CM

## 2020-06-21 DIAGNOSIS — R0602 Shortness of breath: Secondary | ICD-10-CM | POA: Diagnosis present

## 2020-06-21 DIAGNOSIS — N179 Acute kidney failure, unspecified: Secondary | ICD-10-CM | POA: Diagnosis present

## 2020-06-21 DIAGNOSIS — Z20822 Contact with and (suspected) exposure to covid-19: Secondary | ICD-10-CM | POA: Diagnosis present

## 2020-06-21 DIAGNOSIS — Z79899 Other long term (current) drug therapy: Secondary | ICD-10-CM | POA: Diagnosis not present

## 2020-06-21 DIAGNOSIS — E1122 Type 2 diabetes mellitus with diabetic chronic kidney disease: Secondary | ICD-10-CM | POA: Diagnosis present

## 2020-06-21 DIAGNOSIS — Z8616 Personal history of COVID-19: Secondary | ICD-10-CM

## 2020-06-21 DIAGNOSIS — I132 Hypertensive heart and chronic kidney disease with heart failure and with stage 5 chronic kidney disease, or end stage renal disease: Secondary | ICD-10-CM | POA: Diagnosis present

## 2020-06-21 DIAGNOSIS — I5043 Acute on chronic combined systolic (congestive) and diastolic (congestive) heart failure: Secondary | ICD-10-CM | POA: Diagnosis present

## 2020-06-21 DIAGNOSIS — I5031 Acute diastolic (congestive) heart failure: Secondary | ICD-10-CM | POA: Diagnosis present

## 2020-06-21 DIAGNOSIS — Z0189 Encounter for other specified special examinations: Secondary | ICD-10-CM

## 2020-06-21 DIAGNOSIS — N051 Unspecified nephritic syndrome with focal and segmental glomerular lesions: Secondary | ICD-10-CM

## 2020-06-21 DIAGNOSIS — R7989 Other specified abnormal findings of blood chemistry: Secondary | ICD-10-CM

## 2020-06-21 DIAGNOSIS — I959 Hypotension, unspecified: Secondary | ICD-10-CM | POA: Diagnosis present

## 2020-06-21 DIAGNOSIS — I4891 Unspecified atrial fibrillation: Secondary | ICD-10-CM | POA: Diagnosis present

## 2020-06-21 DIAGNOSIS — I120 Hypertensive chronic kidney disease with stage 5 chronic kidney disease or end stage renal disease: Secondary | ICD-10-CM | POA: Diagnosis present

## 2020-06-21 DIAGNOSIS — N041 Nephrotic syndrome with focal and segmental glomerular lesions: Secondary | ICD-10-CM | POA: Diagnosis present

## 2020-06-21 DIAGNOSIS — Z66 Do not resuscitate: Secondary | ICD-10-CM | POA: Diagnosis not present

## 2020-06-21 DIAGNOSIS — J811 Chronic pulmonary edema: Secondary | ICD-10-CM

## 2020-06-21 DIAGNOSIS — N186 End stage renal disease: Secondary | ICD-10-CM | POA: Diagnosis present

## 2020-06-21 DIAGNOSIS — J9600 Acute respiratory failure, unspecified whether with hypoxia or hypercapnia: Secondary | ICD-10-CM

## 2020-06-21 DIAGNOSIS — D631 Anemia in chronic kidney disease: Secondary | ICD-10-CM | POA: Diagnosis present

## 2020-06-21 DIAGNOSIS — E119 Type 2 diabetes mellitus without complications: Secondary | ICD-10-CM

## 2020-06-21 DIAGNOSIS — I272 Pulmonary hypertension, unspecified: Secondary | ICD-10-CM | POA: Diagnosis present

## 2020-06-21 DIAGNOSIS — Z7982 Long term (current) use of aspirin: Secondary | ICD-10-CM

## 2020-06-21 DIAGNOSIS — R778 Other specified abnormalities of plasma proteins: Secondary | ICD-10-CM

## 2020-06-21 DIAGNOSIS — I255 Ischemic cardiomyopathy: Secondary | ICD-10-CM | POA: Diagnosis present

## 2020-06-21 DIAGNOSIS — I509 Heart failure, unspecified: Secondary | ICD-10-CM

## 2020-06-21 DIAGNOSIS — I10 Essential (primary) hypertension: Secondary | ICD-10-CM

## 2020-06-21 DIAGNOSIS — I248 Other forms of acute ischemic heart disease: Secondary | ICD-10-CM | POA: Diagnosis present

## 2020-06-21 DIAGNOSIS — Z978 Presence of other specified devices: Secondary | ICD-10-CM

## 2020-06-21 LAB — CREATININE, URINE, RANDOM: Creatinine, Urine: 76 mg/dL

## 2020-06-21 LAB — URINALYSIS, COMPLETE (UACMP) WITH MICROSCOPIC
Bilirubin Urine: NEGATIVE
Glucose, UA: NEGATIVE mg/dL
Hgb urine dipstick: NEGATIVE
Ketones, ur: NEGATIVE mg/dL
Leukocytes,Ua: NEGATIVE
Nitrite: NEGATIVE
Protein, ur: >=300 mg/dL — AB
Specific Gravity, Urine: 1.01 (ref 1.005–1.030)
pH: 5 (ref 5.0–8.0)

## 2020-06-21 LAB — BASIC METABOLIC PANEL
Anion gap: 11 (ref 5–15)
BUN: 78 mg/dL — ABNORMAL HIGH (ref 8–23)
CO2: 23 mmol/L (ref 22–32)
Calcium: 8.4 mg/dL — ABNORMAL LOW (ref 8.9–10.3)
Chloride: 99 mmol/L (ref 98–111)
Creatinine, Ser: 6.46 mg/dL — ABNORMAL HIGH (ref 0.44–1.00)
GFR, Estimated: 7 mL/min — ABNORMAL LOW (ref >60)
Glucose, Bld: 146 mg/dL — ABNORMAL HIGH (ref 70–99)
Potassium: 4.9 mmol/L (ref 3.5–5.1)
Sodium: 133 mmol/L — ABNORMAL LOW (ref 135–145)

## 2020-06-21 LAB — BRAIN NATRIURETIC PEPTIDE: B Natriuretic Peptide: 4366.4 pg/mL — ABNORMAL HIGH (ref 0.0–100.0)

## 2020-06-21 LAB — CBC
HCT: 29.8 % — ABNORMAL LOW (ref 36.0–46.0)
Hemoglobin: 9.9 g/dL — ABNORMAL LOW (ref 12.0–15.0)
MCH: 31 pg (ref 26.0–34.0)
MCHC: 33.2 g/dL (ref 30.0–36.0)
MCV: 93.4 fL (ref 80.0–100.0)
Platelets: 373 10*3/uL (ref 150–400)
RBC: 3.19 MIL/uL — ABNORMAL LOW (ref 3.87–5.11)
RDW: 13.6 % (ref 11.5–15.5)
WBC: 10.1 10*3/uL (ref 4.0–10.5)
nRBC: 0 % (ref 0.0–0.2)

## 2020-06-21 LAB — HEPATIC FUNCTION PANEL
ALT: 38 U/L (ref 0–44)
AST: 31 U/L (ref 15–41)
Albumin: 3.3 g/dL — ABNORMAL LOW (ref 3.5–5.0)
Alkaline Phosphatase: 121 U/L (ref 38–126)
Bilirubin, Direct: <0.1 mg/dL (ref 0.0–0.2)
Total Bilirubin: 0.8 mg/dL (ref 0.3–1.2)
Total Protein: 6.9 g/dL (ref 6.5–8.1)

## 2020-06-21 LAB — LACTIC ACID, PLASMA: Lactic Acid, Venous: 0.9 mmol/L (ref 0.5–1.9)

## 2020-06-21 LAB — TRIGLYCERIDES: Triglycerides: 50 mg/dL (ref <150)

## 2020-06-21 LAB — TROPONIN I (HIGH SENSITIVITY)
Troponin I (High Sensitivity): 46 ng/L — ABNORMAL HIGH (ref <18)
Troponin I (High Sensitivity): 54 ng/L — ABNORMAL HIGH (ref <18)

## 2020-06-21 LAB — RESP PANEL BY RT-PCR (FLU A&B, COVID) ARPGX2
Influenza A by PCR: NEGATIVE
Influenza A by PCR: NEGATIVE
Influenza B by PCR: NEGATIVE
Influenza B by PCR: NEGATIVE
SARS Coronavirus 2 by RT PCR: NEGATIVE
SARS Coronavirus 2 by RT PCR: NEGATIVE

## 2020-06-21 LAB — FERRITIN: Ferritin: 183 ng/mL (ref 11–307)

## 2020-06-21 LAB — PROCALCITONIN: Procalcitonin: <0.1 ng/mL

## 2020-06-21 LAB — URINE DRUG SCREEN, QUALITATIVE (ARMC ONLY)
Amphetamines, Ur Screen: NOT DETECTED
Barbiturates, Ur Screen: NOT DETECTED
Benzodiazepine, Ur Scrn: NOT DETECTED
Cannabinoid 50 Ng, Ur ~~LOC~~: NOT DETECTED
Cocaine Metabolite,Ur ~~LOC~~: NOT DETECTED
MDMA (Ecstasy)Ur Screen: NOT DETECTED
Methadone Scn, Ur: NOT DETECTED
Opiate, Ur Screen: NOT DETECTED
Phencyclidine (PCP) Ur S: NOT DETECTED
Tricyclic, Ur Screen: NOT DETECTED

## 2020-06-21 LAB — CBG MONITORING, ED
Glucose-Capillary: 148 mg/dL — ABNORMAL HIGH (ref 70–99)
Glucose-Capillary: 182 mg/dL — ABNORMAL HIGH (ref 70–99)

## 2020-06-21 LAB — SODIUM, URINE, RANDOM: Sodium, Ur: 33 mmol/L

## 2020-06-21 LAB — MAGNESIUM: Magnesium: 3 mg/dL — ABNORMAL HIGH (ref 1.7–2.4)

## 2020-06-21 LAB — C-REACTIVE PROTEIN: CRP: 0.8 mg/dL (ref <1.0)

## 2020-06-21 LAB — FIBRINOGEN: Fibrinogen: 539 mg/dL — ABNORMAL HIGH (ref 210–475)

## 2020-06-21 LAB — LACTATE DEHYDROGENASE: LDH: 213 U/L — ABNORMAL HIGH (ref 98–192)

## 2020-06-21 LAB — FIBRIN DERIVATIVES D-DIMER (ARMC ONLY): Fibrin derivatives D-dimer (ARMC): 1232.8 ng/mL (FEU) — ABNORMAL HIGH (ref 0.00–499.00)

## 2020-06-21 MED ORDER — FUROSEMIDE 10 MG/ML IJ SOLN
20.0000 mg | Freq: Once | INTRAMUSCULAR | Status: AC
  Administered 2020-06-21: 20 mg via INTRAVENOUS
  Filled 2020-06-21: qty 4

## 2020-06-21 MED ORDER — ASPIRIN EC 81 MG PO TBEC
81.0000 mg | DELAYED_RELEASE_TABLET | Freq: Every day | ORAL | Status: DC
Start: 2020-06-22 — End: 2020-06-23
  Filled 2020-06-21: qty 1

## 2020-06-21 MED ORDER — FUROSEMIDE 10 MG/ML IJ SOLN
40.0000 mg | Freq: Once | INTRAMUSCULAR | Status: AC
  Administered 2020-06-21: 40 mg via INTRAVENOUS
  Filled 2020-06-21: qty 4

## 2020-06-21 MED ORDER — ASPIRIN 81 MG PO CHEW
324.0000 mg | CHEWABLE_TABLET | Freq: Once | ORAL | Status: AC
  Administered 2020-06-21: 324 mg via ORAL
  Filled 2020-06-21: qty 4

## 2020-06-21 MED ORDER — INSULIN ASPART 100 UNIT/ML ~~LOC~~ SOLN: 0.0000 [IU] | SUBCUTANEOUS | Status: DC

## 2020-06-21 MED ORDER — ACETAMINOPHEN 325 MG PO TABS
650.0000 mg | ORAL_TABLET | Freq: Four times a day (QID) | ORAL | Status: DC | PRN
Start: 2020-06-21 — End: 2020-06-23

## 2020-06-21 MED ORDER — ONDANSETRON HCL 4 MG/2ML IJ SOLN: 4.0000 mg | Freq: Four times a day (QID) | INTRAMUSCULAR | Status: DC | PRN

## 2020-06-21 MED ORDER — HEPARIN SODIUM (PORCINE) 5000 UNIT/ML IJ SOLN
5000.0000 [IU] | Freq: Three times a day (TID) | INTRAMUSCULAR | Status: DC
  Administered 2020-06-21 – 2020-06-26 (×14): 5000 [IU] via SUBCUTANEOUS
  Filled 2020-06-21 (×15): qty 1

## 2020-06-21 MED ORDER — ACETAMINOPHEN 650 MG RE SUPP: 650.0000 mg | Freq: Four times a day (QID) | RECTAL | Status: DC | PRN

## 2020-06-21 MED ORDER — INSULIN ASPART 100 UNIT/ML ~~LOC~~ SOLN
0.0000 [IU] | Freq: Three times a day (TID) | SUBCUTANEOUS | Status: DC
  Administered 2020-06-22: 2 [IU] via SUBCUTANEOUS
  Administered 2020-06-22: 1 [IU] via SUBCUTANEOUS
  Filled 2020-06-21 (×2): qty 1

## 2020-06-21 MED ORDER — HYDRALAZINE HCL 50 MG PO TABS
25.0000 mg | ORAL_TABLET | Freq: Two times a day (BID) | ORAL | Status: DC
  Administered 2020-06-21: 25 mg via ORAL
  Filled 2020-06-21 (×2): qty 1

## 2020-06-21 MED ORDER — ONDANSETRON 4 MG PO TBDP: 4.0000 mg | ORAL_TABLET | Freq: Three times a day (TID) | ORAL | Status: DC | PRN

## 2020-06-21 MED ORDER — FUROSEMIDE 10 MG/ML IJ SOLN
40.0000 mg | Freq: Two times a day (BID) | INTRAMUSCULAR | Status: DC
  Administered 2020-06-22 – 2020-06-30 (×16): 40 mg via INTRAVENOUS
  Filled 2020-06-21 (×19): qty 4

## 2020-06-21 NOTE — ED Notes (Signed)
Pt titrated to 15L HFNC for o2sat

## 2020-06-21 NOTE — ED Notes (Signed)
Pt sats reamin in low 80s. RT called at this time to place pt on [address] [place].

## 2020-06-21 NOTE — ED Notes (Addendum)
NRB at 15L applied to pt on top of HF [place].  Dr. Velia Meyer made aware of pt's respiratory status.

## 2020-06-21 NOTE — H&P (Signed)
History and Physical    PLEASE NOTE THAT DRAGON DICTATION SOFTWARE WAS USED IN THE CONSTRUCTION OF THIS NOTE.   Stephanie Casey OHY:[id number] DOB: 10-22-1955 DOA: 06/29/2020  PCP: Freddy Finner, NP Patient coming from: home   I have personally briefly reviewed patient's old medical records in [place]  Chief Complaint: Shortness of breath  HPI: Stephanie Casey is a 65 y.o. female with medical history significant for hypertension, type 2 diabetes mellitus, stage IIIb chronic kidney disease, anemia of chronic kidney disease with baseline hemoglobin 10-11, who is admitted to [facility] on 06/28/2020 with acute hypoxic respiratory failure in the setting of new diagnosis of acutely decompensated heart failure after presenting from home to [facility] Emergency Department complaining of shortness of breath.   The patient reports 4 to 5 days of progressive shortness of breath associated with orthopnea and new onset edema involving the bilateral lower extremities.  She also reports new onset nonproductive cough over that time in the absence of any hemoptysis.  Denies any lower extremity edema or calf tenderness.  Has noted some mild wheezing over the last few days, which is also new for her.  Denies any associated recent chest pain or trauma.  Denies any recent palpitations, diaphoresis, presyncope, syncope.  She does however note intermittent nausea resulting in 2-3 episodes of nonbloody, nonbilious emesis over the last few days.  Not associate with any abdominal pain, diarrhea, melena, or hematochezia.  She also denies any recent dysuria, gross hematuria, or urinary urgency/frequency.  Overall, she denies any recent changes in her urinary habits, noting any recent decline in urine output.  No recent rash.  Denies any recent subjective fever, chills, rigors, or generalized myalgias.  The patient denies any recent traveling, domestically or internationally.  Denies any  recent surgical procedures.  No personal or family history of DVT/PE.  She confirms that she is a lifelong non-smoker.  Of note, the patient reports an unintentional weight loss of 20 pounds over the last 1 month.  Patient denies any known history of prior heart failure, and initial chart review yields no prior echocardiogram results.  Denies any use of recreational drugs or regular consumption of alcohol.  Of note, the patient was diagnosed with COVID-19 infection in August 2020, with positive COVID-19 result on 02/09/2019, prompting admission to [facility] at the time.  She denies any known chronic underlying pulmonary pathology, and confirms she confirms no baseline supplemental oxygen requirements.   Medical history is also notable for stage IIIb chronic kidney disease, baseline creatinine of 1.7, most recently in August 2020.  This is also associated with history of anemia of chronic kidney disease, with baseline hemoglobin 10-11.     ED Course:  Vital signs in the ED were notable for the following: Tetramex 98.3; heart rate 97-1 02; blood pressure 137/65 - 157/75; respiratory rate 20-32; oxygen saturation initially noted to be 86% on room air, with subsequent improvement to 94% on 3 L nasal cannula.  Labs were notable for the following: CMP was notable for the following: Sodium 133, potassium 4.9, bicarbonate 23, BUN 78, creatinine 6.46 relative to most recent prior creatinine data point of 1.70 in August 2020, glucose 146.  BNP 4300.  Initial high-sensitivity troponin I found to be 46, with repeat value noted to be 54.  D-dimer 1200.  Urinalysis has been ordered, with result currently pending.  Procalcitonin is also been ordered, with result pending.  CBC notable for the following: White blood  cell count of 10,100, hemoglobin 9.9 relative to most recent prior value of 10.4 on 02/10/2019, platelets 373.  Lactic acid 0.9.  Nasopharyngeal COVID-19/influenza PCR performed in the ED today was found to be  negative.  Chest x-ray showed evidence of moderate to markedly severe bilateral infiltrates in addition to small bilateral pleural effusions in the absence of any evidence of pneumothorax.  Renal ultrasound was ordered in the ED, with result currently pending.  Additionally, EKG has been ordered, with result currently pending.  The emergency department physician discussed the patient's case with the on-call cardiologist, Dr. Agustin Cree of [facility], who agreed that presentation appears most consistent with acutely decompensated heart failure, and agreed with plan for IV diuresis.  He recommended administration of full dose aspirin x1, but did not feel that heparin drip was warranted at this time.  Cardiology will formally consult.   While in the ED, the following were administered: Aspirin.  24 mg p.o. x1, Lasix 40 mg IV x1.  Subsequently, the patient was admitted to the PCU for further evaluation management presenting acute hypoxic respiratory failure in the setting of new diagnosis of acutely decompensated heart failure.      Review of Systems: As per HPI otherwise 10 point review of systems negative.   Past Medical History:  Diagnosis Date  . Diabetes mellitus without complication ([place])   . Hyperlipidemia   . Hypertension     Past Surgical History:  Procedure Laterality Date  . EYE SURGERY      Social History:  reports that she has never smoked. She has never used smokeless tobacco. She reports that she does not drink alcohol and does not use drugs.   Allergies  Allergen Reactions  . Hydrochlorothiazide     Pancreatitis    Family history reviewed and not pertinent.    Prior to Admission medications   Medication Sig Start Date End Date Taking? Authorizing Provider  amLODipine (NORVASC) 10 MG tablet Take 10 mg by mouth daily.    [provider]  aspirin EC 81 MG tablet Take 81 mg by mouth daily.    [provider]  atenolol (TENORMIN) 100 MG tablet Take 100 mg  by mouth daily.    [provider]  hydrALAZINE (APRESOLINE) 25 MG tablet Take 25 mg by mouth 2 (two) times daily.    [provider]  insulin NPH Human (NOVOLIN N) 100 UNIT/ML injection Inject 25 Units into the skin 2 (two) times daily.    [provider]  traZODone (DESYREL) 50 MG tablet Take 50-100 mg by mouth at bedtime.    [provider]     Objective    Physical Exam: Vitals:   06/20/2020 1440 06/27/2020 1441 07/17/2020 1900  BP: 137/65  (!) 157/75  Pulse: 97  (!) 102  Resp: 20  (!) 32  Temp: 98.3 F (36.8 C)    TempSrc: Oral    SpO2: 92%  92%  Weight:  68 kg   Height:  5\' 1"  (1.549 m)     General: appears to be stated age; alert, oriented; mildly increased work of breathing noted Skin: warm, dry, no rash Head:  AT/[place] Mouth:  Oral mucosa membranes appear moist, normal dentition Neck: supple; trachea midline Heart: Mildly tachycardic, but regular; did not appreciate any M/R/G Lungs: Bilateral crackles noted , in the absence of rhonchi Abdomen: + BS; soft, ND, NT Vascular: 2+ pedal pulses b/l; 2+ radial pulses b/l Extremities: 2+ edema in the bilateral lower extremities , no  muscle wasting Neuro: strength and sensation intact in upper and lower extremities b/l   Labs on Admission: I have personally reviewed following labs and imaging studies  CBC: Recent Labs  Lab 06/27/2020 1454  WBC 10.1  HGB 9.9*  HCT 29.8*  MCV 93.4  PLT 876   Basic Metabolic Panel: Recent Labs  Lab 07/13/2020 1454  NA 133*  K 4.9  CL 99  CO2 23  GLUCOSE 146*  BUN 78*  CREATININE 6.46*  CALCIUM 8.4*   GFR: Estimated Creatinine Clearance: 7.9 mL/min (A) (by C-G formula based on SCr of 6.46 mg/dL (H)). Liver Function Tests: Recent Labs  Lab 07/12/2020 1454  AST 31  ALT 38  ALKPHOS 121  BILITOT 0.8  PROT 6.9  ALBUMIN 3.3*   No results for input(s): LIPASE, AMYLASE in the last 168 hours. No results for input(s): AMMONIA in the last 168  hours. Coagulation Profile: No results for input(s): INR, PROTIME in the last 168 hours. Cardiac Enzymes: No results for input(s): CKTOTAL, CKMB, CKMBINDEX, TROPONINI in the last 168 hours. BNP (last 3 results) No results for input(s): PROBNP in the last 8760 hours. HbA1C: No results for input(s): HGBA1C in the last 72 hours. CBG: No results for input(s): GLUCAP in the last 168 hours. Lipid Profile: Recent Labs    06/29/2020 1454  TRIG 50   Thyroid Function Tests: No results for input(s): TSH, T4TOTAL, FREET4, T3FREE, THYROIDAB in the last 72 hours. Anemia Panel: Recent Labs    07/14/2020 1454  FERRITIN 183   Urine analysis:    Component Value Date/Time   COLORURINE STRAW (A) 02/09/2019 1550   APPEARANCEUR CLEAR (A) 02/09/2019 1550   APPEARANCEUR Hazy 01/15/2013 2354   LABSPEC 1.006 02/09/2019 1550   LABSPEC 1.011 01/15/2013 2354   PHURINE 7.0 02/09/2019 1550   GLUCOSEU NEGATIVE 02/09/2019 1550   GLUCOSEU 50 mg/dL 01/15/2013 2354   HGBUR NEGATIVE 02/09/2019 1550   BILIRUBINUR NEGATIVE 02/09/2019 1550   BILIRUBINUR Negative 01/15/2013 [place] 02/09/2019 1550   PROTEINUR 100 (A) 02/09/2019 1550   NITRITE NEGATIVE 02/09/2019 1550   LEUKOCYTESUR NEGATIVE 02/09/2019 1550   LEUKOCYTESUR 3+ 01/15/2013 2354    Radiological Exams on Admission: DG Chest 2 View  Result Date: 06/20/2020 CLINICAL DATA:  Chest pressure, shortness of breath and intermittent nausea. EXAM: CHEST - 2 VIEW COMPARISON:  June 26, 2010 FINDINGS: Moderate to marked severity predominantly bilateral perihilar, bilateral suprahilar and bilateral infrahilar infiltrates are seen. Extension to involve the bilateral lung bases is also noted. There are small bilateral pleural effusions. No pneumothorax is identified. The cardiac silhouette is moderately enlarged. The visualized skeletal structures are unremarkable. IMPRESSION: 1. Moderate to marked severity bilateral infiltrates. 2. Small bilateral  pleural effusions. Electronically Signed   By: Virgina Norfolk M.D.   On: 06/20/2020 15:28      Assessment/Plan   Wandra Scot P Orville Widmann is a 65 y.o. female with medical history significant for hypertension, type 2 diabetes mellitus, stage IIIb chronic kidney disease, anemia of chronic kidney disease with baseline hemoglobin 10-11, who is admitted to [facility] on 06/26/2020 with acute hypoxic respiratory failure in the setting of new diagnosis of acutely decompensated heart failure after presenting from home to [facility] Emergency Department complaining of shortness of breath.    Principal Problem:   Acute respiratory failure with hypoxia (HCC) Active Problems:   Acute heart failure with preserved ejection fraction (HFpEF) ([place])   Hypertension   Diabetes mellitus without complication ([place])  ARF (acute renal failure) (HCC)   SOB (shortness of breath)   Elevated troponin   #) Acute hypoxic respiratory failure d/t acute heart failure with preserved EF: In the context of no known history of chronic underlying heart failure, the patient presents with 4 to 5 days of progressive shortness of breath associated with orthopnea, new onset nonproductive cough, new onset development of edema in the bilateral lower extremities, wheezing, acute hypoxic respiratory failure requiring now greater than 4 L nasal cannula in order to maintain oxygen saturations greater than or equal to 92% the context of no baseline supplemental oxygen requirements, elevated BNP, and chest x-ray showing evidence of bilateral infiltrates as well as bilateral pleural effusions.  Appears to represent a new diagnosis for this patient, warranting additional evaluation.  Source of the patient's failure is currently unclear.  Denies any use of recreational drugs or alcohol.  Ischemic cardiomyopathy is in the differential, although ACS appears to be less likely at this time, with mildly elevated troponin felt to be on the  basis of a type II process in the setting of supply demand mismatch as a result of associated acute hypoxic respiratory failure as opposed to representing a type I ischemia due to acute plaque rupture.  Of note, EKG is currently pending.  Patient's case and imaging were discussed with the on-call cardiologist, Dr. Agustin Cree of [facility], who agreed that presentation appears most consistent with acutely decompensated heart failure, and agreed with plan for IV diuresis, and conveyed that cardiology will formally consult.  Lasix 40 g IV x1 administered in the ED.    Plan: Lasix 40 mg IV twice daily.  Monitor strict I's and O's and daily weights.  Repeat BMP in the morning to monitor interval renal function as well as subsequent potassium level.  Add on serum magnesium level, and repeat this level in the morning as well.  Monitor on telemetry.  Monitor continuous pulse oximetry.  Echocardiogram has been ordered for the morning.  Cardiology consulted, as above.  Check urinary drug screen, TSH.      #) Mildly elevated troponin: Mildly elevated initial troponin of 46, which is subsequently trended up slightly to 54; no prior high-sensitivity troponin I value available for point of comparison.  Suspect that this mildly elevated troponin is on the basis of supply demand mismatch in the setting of acute hypoxic respiratory failure due to acutely decompensated heart failure, as opposed to representing a type I process due to plaque rupture.  Additionally, significant interval decline in renal clearance of troponin in the setting of presenting acute renal failure is likely also contributing to presenting troponin elevation.  EKG currently pending, will closely follow for results of this, although clinically, ACS is felt to be less likely at this time relative to a type II supply demand mismatch, as above, but will closely monitor on telemetry overnight while treating underlying suspected acutely decompensated heart  failure, as further described above. consistent with this train of thought, consulted cardiologist Agustin Cree recommends full dose aspirin in the absence of initiation of heparin drip.   Plan: Full dose aspirin x1.  Resume home daily baby aspirin tomorrow morning.  Check echocardiogram in the morning to evaluate for any evidence of focal wall motion abnormalities.  Follow for result EKG.  Monitor on telemetry.  Monitor continuous pulse oximetry.  Repeat high-sensitivity troponin high in the morning.  Add on serum magnesium level.    #) Acute renal failure: Presenting creatinine found to be 6.46 relative to stage  IIIb chronic kidney disease with associated creatinine of 1.70.  Suspect that this is prerenal in nature the basis of diminished renal perfusion due to decline in renal perfusion gradient in the setting of acutely decompensated heart failure, as above.  Consequently, will treat suspected heart failure with IV diuresis, as above, and monitor for ensuing provement in renal function, which would serve to be supportive for the suspected underlying etiology.  Urinalysis has been ordered, with result currently pending.  Plan: Work-up and management of acutely compensated heart failure, including IV diuresis, as above.  Monitor strict I's and O's and daily weights.  Attempt to avoid nephrotoxic agents.  Follow-up result urinalysis.  We'll also add on random urine sodium as well as random urine creatinine.  Repeat BMP in the morning.  Renal ultrasound, with result currently pending.     #) Type 2 diabetes mellitus: On NPH 25 units subcu twice daily as an outpatient.  Presenting blood sugar noted to be 146 per presenting CMP.  Plan: Hold home NPH for now.  Accu-Cheks before every meal and at bedtime with low-dose sliding scale insulin.  Check hemoglobin A1c.      #) Essential hypertension: Outpatient antihypertensive regimen consists of Norvasc, atenolol, and hydralazine.  Presenting systolic  blood pressures in the 130s.  Will close monitoring blood pressures for evidence of hypotension due to interval IV diuresis, as above, will also evaluating for adequacy of afterload reduction in the context of acutely compensated failure.  Plan: Continue home hydralazine for now.  Will hold home atenolol and Norvasc for now.  IV diuresis, as above.  Close monitoring of ensuing blood pressure via routine vital signs.     DVT prophylaxis: Heparin 5000 units subcu 3 times daily Code Status: Full code Family Communication: none Disposition Plan: Per Rounding Team Consults called: Case was discussed with the on-call cardiologist, Dr. Agustin Cree, as further described above. Admission status: Inpatient; PCU.     Of note, this patient was added by me to the following Admit List/Treatment Team:  [facility]      PLEASE NOTE THAT DRAGON DICTATION SOFTWARE WAS USED IN THE CONSTRUCTION OF THIS NOTE.   [place] [facility] Pager [phone] From 12PM- 12AM  Otherwise, please contact night-coverage  [email or web address] Password [facility]  07/18/2020, 7:28 PM

## 2020-06-21 NOTE — ED Provider Notes (Signed)
**Stephanie [name]** Stephanie Stephanie Casey   CSN: [id number] Arrival date & time: 07/17/2020  1421     History Chief Complaint  Patient presents with  . Chest Pain  . Shortness of Breath    Stephanie Stephanie Casey is a 65 y.o. female.  HPI with a past medical history of HTN, HDL, and DM who presents for assessment of approximately 5 days of worsening cough, shortness of breath, chest tightness as well as nonbloody nonbilious vomiting, decreased appetite, fatigue and myalgias.  Patient has not been effective against Covid.  She denies any headache, earache, sore throat, urinary symptoms, abdominal pain, back pain, rash or focal extremity pain.  No recent falls or injuries.  She denies any EtOH use, illicit drug use, tobacco abuse.  She denies any change in her urine output but does Stephanie Casey she has had very little to eat or drink for the last couple days.  No other acute concerns at this time.     Past Medical History:  Diagnosis Date  . Diabetes mellitus without complication ([place])   . Hyperlipidemia   . Hypertension     Patient Active Problem List   Diagnosis Date Noted  . COVID-19 02/10/2019  . Intractable nausea and vomiting 02/09/2019    Past Surgical History:  Procedure Laterality Date  . EYE SURGERY       OB History   No obstetric history on file.     No family history on file.  Social History   Tobacco Use  . Smoking status: Never Smoker  . Smokeless tobacco: Never Used  Substance Use Topics  . Alcohol use: Never  . Drug use: Never    Home Medications Prior to Admission medications   Medication Sig Start Date End Date Taking? Authorizing Provider  amLODipine (NORVASC) 10 MG tablet Take 10 mg by mouth daily.    [provider]  aspirin EC 81 MG tablet Take 81 mg by mouth daily.    [provider]  atenolol (TENORMIN) 100 MG tablet Take 100 mg by mouth daily.    [provider]  hydrALAZINE (APRESOLINE) 25  MG tablet Take 25 mg by mouth 2 (two) times daily.    [provider]  insulin NPH Human (NOVOLIN N) 100 UNIT/ML injection Inject 25 Units into the skin 2 (two) times daily.    [provider]  traZODone (DESYREL) 50 MG tablet Take 50-100 mg by mouth at bedtime.    [provider]    Allergies    Hydrochlorothiazide  Review of Systems   Review of Systems  Constitutional: Positive for appetite change and chills. Negative for fever.  HENT: Negative for ear pain and sore throat.   Eyes: Negative for pain and visual disturbance.  Respiratory: Positive for cough and shortness of breath.   Cardiovascular: Positive for chest pain. Negative for palpitations.  Gastrointestinal: Positive for nausea and vomiting. Negative for abdominal pain.  Genitourinary: Negative for dysuria and hematuria.  Musculoskeletal: Positive for myalgias. Negative for arthralgias and back pain.  Skin: Negative for color change and rash.  Neurological: Positive for headaches. Negative for seizures and syncope.  All other systems reviewed and are negative.   Physical Exam Updated Vital Signs BP (!) 157/75   Pulse (!) 102   Temp 98.3 F (36.8 C) (Oral)   Resp (!) 32   Ht 5\' 1"  (1.549 m)   Wt 68 kg   SpO2 92%   BMI 28.34 kg/m   Physical  Exam Vitals and nursing Stephanie Casey reviewed.  Constitutional:      General: She is not in acute distress.    Appearance: She is well-developed and well-nourished.  HENT:     Head: Normocephalic and atraumatic.     Right Ear: External ear normal.     Left Ear: External ear normal.     Nose: Nose normal.  Eyes:     Conjunctiva/sclera: Conjunctivae normal.  Cardiovascular:     Rate and Rhythm: Normal rate and regular rhythm.     Pulses: Normal pulses.     Heart sounds: No murmur heard.   Pulmonary:     Effort: Pulmonary effort is normal. Tachypnea present. No respiratory distress.     Breath sounds: Examination of the right-lower field reveals  rhonchi and rales. Examination of the left-lower field reveals rhonchi and rales. Rhonchi and rales present.  Abdominal:     Palpations: Abdomen is soft.     Tenderness: There is no abdominal tenderness. There is no right CVA tenderness or left CVA tenderness.  Musculoskeletal:     Cervical back: Neck supple.     Right lower leg: Edema present.     Left lower leg: Edema present.  Skin:    General: Skin is warm and dry.     Capillary Refill: Capillary refill takes less than 2 seconds.  Neurological:     Mental Status: She is alert and oriented to person, place, and time.  Psychiatric:        Mood and Affect: Mood and affect and mood normal.     ED Results / Procedures / Treatments   Labs (all labs ordered are listed, but only abnormal results are displayed) Labs Reviewed  BASIC METABOLIC PANEL - Abnormal; Notable for the following components:      Result Value   Sodium 133 (*)    Glucose, Bld 146 (*)    BUN 78 (*)    Creatinine, Ser 6.46 (*)    Calcium 8.4 (*)    GFR, Estimated 7 (*)    All other components within normal limits  CBC - Abnormal; Notable for the following components:   RBC 3.19 (*)    Hemoglobin 9.9 (*)    HCT 29.8 (*)    All other components within normal limits  BRAIN NATRIURETIC PEPTIDE - Abnormal; Notable for the following components:   B Natriuretic Peptide 4,366.4 (*)    All other components within normal limits  HEPATIC FUNCTION PANEL - Abnormal; Notable for the following components:   Albumin 3.3 (*)    All other components within normal limits  FIBRINOGEN - Abnormal; Notable for the following components:   Fibrinogen 539 (*)    All other components within normal limits  LACTATE DEHYDROGENASE - Abnormal; Notable for the following components:   LDH 213 (*)    All other components within normal limits  TROPONIN I (HIGH SENSITIVITY) - Abnormal; Notable for the following components:   Troponin I (High Sensitivity) 46 (*)    All other components  within normal limits  TROPONIN I (HIGH SENSITIVITY) - Abnormal; Notable for the following components:   Troponin I (High Sensitivity) 54 (*)    All other components within normal limits  RESP PANEL BY RT-PCR (FLU A&B, COVID) ARPGX2  CULTURE, BLOOD (ROUTINE X 2)  CULTURE, BLOOD (ROUTINE X 2)  LACTIC ACID, PLASMA  FERRITIN  TRIGLYCERIDES  URINALYSIS, COMPLETE (UACMP) WITH MICROSCOPIC  PROCALCITONIN  LACTIC ACID, PLASMA  C-REACTIVE PROTEIN  HEMOGLOBIN A1C  FIBRIN  DERIVATIVES D-DIMER [facility] ONLY)    EKG None Sinus rhythm with a ventricular rate of 94, right axis deviation, no clear evidence of acute ischemia or significant underlying arrhythmia.  Radiology DG Chest 2 View  Result Date: 06/20/2020 CLINICAL DATA:  Chest pressure, shortness of breath and intermittent nausea. EXAM: CHEST - 2 VIEW COMPARISON:  June 26, 2010 FINDINGS: Moderate to marked severity predominantly bilateral perihilar, bilateral suprahilar and bilateral infrahilar infiltrates are seen. Extension to involve the bilateral lung bases is also noted. There are small bilateral pleural effusions. No pneumothorax is identified. The cardiac silhouette is moderately enlarged. The visualized skeletal structures are unremarkable. IMPRESSION: 1. Moderate to marked severity bilateral infiltrates. 2. Small bilateral pleural effusions. Electronically Signed   By: Virgina Norfolk M.D.   On: 07/09/2020 15:28   Bilateral infiltrates consistent with pneumonia as well as bilateral small pleural effusions.  No significant pulmonary edema or pneumothorax.  Procedures .Critical Care Performed by: Lucrezia Starch, MD Authorized by: Lucrezia Starch, MD   Critical care provider statement:    Critical care time (minutes):  45   Critical care time was exclusive of:  Separately billable procedures and treating other patients   Critical care was necessary to treat or prevent imminent or life-threatening deterioration of the following  conditions:  Respiratory failure   Critical care was time spent personally by me on the following activities:  Discussions with consultants, evaluation of patient's response to treatment, examination of patient, ordering and performing treatments and interventions, ordering and review of laboratory studies, ordering and review of radiographic studies, pulse oximetry, re-evaluation of patient's condition, obtaining history from patient or surrogate and review of old charts   (including critical care time)  Medications Ordered in ED Medications  insulin aspart (novoLOG) injection 0-15 Units (has no administration in time range)  aspirin chewable tablet 324 mg (has no administration in time range)  ondansetron (ZOFRAN-ODT) disintegrating tablet 4 mg (has no administration in time range)  furosemide (LASIX) injection 40 mg (has no administration in time range)    ED Course  I have reviewed the triage vital signs and the nursing notes.  Pertinent labs & imaging results that were available during my care of the patient were reviewed by me and considered in my medical decision making (see chart for details).    MDM Rules/Calculators/A&P                          Patient presents with above to history exam for assessment of approximately 4 to 5 days of worsening cough, shortness of breath, chest pains as well as normal ambulation vomiting, myalgias, decreased appetite and fatigue.  On arrival patient is noted to be hypoxic on room air with SPO2 of 87%.  This improved to 94% on 3 L.  She is otherwise hemodynamically stable.  On exam she has some bilateral rales and rhonchi as well as tachypnea noted.  Initial differential includes heart failure, pneumonia including viral etiology such as Covid, ACS, arrhythmia, acute anemia from metabolic derangements, infectious gastroenteritis, and cystitis.    BMP remarkable for hyperglycemia with a glucose of 146 as well as evidence of AKI with a creatinine of  6.46 compared to 1.71-year ago.  Patient's BUN is also elevated at 78.  No other significant electrolyte or metabolic derangements.  CBC has evidence of anemia of hemoglobin 9.9 compared to 10.41-year ago.  No leukocytosis and normal platelets.  Initial troponin is 46.  Concern  for possible ACS versus ongoing demand ischemia.  Given bilateral effusions on chest x-ray as well as lower extremity edema on exam and evidence of AKI I am concerned for possible undiagnosed heart failure contributing to cardiorenal syndrome versus prerenal AKI from poor p.o. intake and GI losses.  Patient given ASA.  BNP is 4236 with supporting diagnosis of heart failure.  Did discuss my concern with on-call cardiologist Dr. [name] Persia who agreed with diuresis and aspirin as well as possible admission.  I will plan to admit to hospitalist service for further evaluation and management.     Final Clinical Impression(s) / ED Diagnoses Final diagnoses:  Acute respiratory failure with hypoxia (HCC)  Troponin I above reference range  AKI (acute kidney injury) ([place])  Elevated brain natriuretic peptide (BNP) level  Acute heart failure, unspecified heart failure type [facility])    Rx / DC Orders ED Discharge Orders    None       Lucrezia Starch, MD 07/03/2020 1927

## 2020-06-21 NOTE — ED Notes (Signed)
Patient transported to X-ray 

## 2020-06-21 NOTE — ED Notes (Signed)
Patient placed on 2L O2, sats 95-96%.

## 2020-06-21 NOTE — ED Notes (Signed)
Admission MD at bedside with translation tablet.

## 2020-06-21 NOTE — ED Triage Notes (Signed)
One week ago took natural drops to help with kidneys. States since then, has had chest pressure that is not relieved. Unsure of what the bottle was. +Shortness of breath, +intermittent nausea.

## 2020-06-22 ENCOUNTER — Other Ambulatory Visit: Payer: Self-pay

## 2020-06-22 ENCOUNTER — Inpatient Hospital Stay: Payer: Medicaid Other

## 2020-06-22 ENCOUNTER — Encounter: Admission: EM | Disposition: E | Payer: Self-pay | Source: Home / Self Care | Attending: Internal Medicine

## 2020-06-22 ENCOUNTER — Inpatient Hospital Stay (HOSPITAL_COMMUNITY)
Admit: 2020-06-22 | Discharge: 2020-06-22 | Disposition: A | Discharge status: Home / Self Care | Payer: Medicaid Other | Attending: Internal Medicine | Admitting: Internal Medicine

## 2020-06-22 DIAGNOSIS — N179 Acute kidney failure, unspecified: Secondary | ICD-10-CM

## 2020-06-22 DIAGNOSIS — N185 Chronic kidney disease, stage 5: Secondary | ICD-10-CM

## 2020-06-22 DIAGNOSIS — R0602 Shortness of breath: Secondary | ICD-10-CM

## 2020-06-22 DIAGNOSIS — J810 Acute pulmonary edema: Secondary | ICD-10-CM

## 2020-06-22 DIAGNOSIS — I5031 Acute diastolic (congestive) heart failure: Secondary | ICD-10-CM

## 2020-06-22 DIAGNOSIS — N189 Chronic kidney disease, unspecified: Secondary | ICD-10-CM

## 2020-06-22 DIAGNOSIS — E119 Type 2 diabetes mellitus without complications: Secondary | ICD-10-CM

## 2020-06-22 DIAGNOSIS — J9601 Acute respiratory failure with hypoxia: Secondary | ICD-10-CM

## 2020-06-22 DIAGNOSIS — J811 Chronic pulmonary edema: Secondary | ICD-10-CM

## 2020-06-22 LAB — CBC WITH DIFFERENTIAL/PLATELET
Abs Immature Granulocytes: 0.09 10*3/uL — ABNORMAL HIGH (ref 0.00–0.07)
Basophils Absolute: 0 10*3/uL (ref 0.0–0.1)
Basophils Relative: 0 %
Eosinophils Absolute: 0 10*3/uL (ref 0.0–0.5)
Eosinophils Relative: 0 %
HCT: 30.6 % — ABNORMAL LOW (ref 36.0–46.0)
Hemoglobin: 10.1 g/dL — ABNORMAL LOW (ref 12.0–15.0)
Immature Granulocytes: 1 %
Lymphocytes Relative: 4 %
Lymphs Abs: 0.5 10*3/uL — ABNORMAL LOW (ref 0.7–4.0)
MCH: 31 pg (ref 26.0–34.0)
MCHC: 33 g/dL (ref 30.0–36.0)
MCV: 93.9 fL (ref 80.0–100.0)
Monocytes Absolute: 0.4 10*3/uL (ref 0.1–1.0)
Monocytes Relative: 3 %
Neutro Abs: 11.4 10*3/uL — ABNORMAL HIGH (ref 1.7–7.7)
Neutrophils Relative %: 92 %
Platelets: 399 10*3/uL (ref 150–400)
RBC: 3.26 MIL/uL — ABNORMAL LOW (ref 3.87–5.11)
RDW: 13.7 % (ref 11.5–15.5)
WBC: 12.4 10*3/uL — ABNORMAL HIGH (ref 4.0–10.5)
nRBC: 0 % (ref 0.0–0.2)

## 2020-06-22 LAB — CBG MONITORING, ED
Glucose-Capillary: 123 mg/dL — ABNORMAL HIGH (ref 70–99)
Glucose-Capillary: 199 mg/dL — ABNORMAL HIGH (ref 70–99)
Glucose-Capillary: 75 mg/dL (ref 70–99)
Glucose-Capillary: 75 mg/dL (ref 70–99)
Glucose-Capillary: 79 mg/dL (ref 70–99)
Glucose-Capillary: 94 mg/dL (ref 70–99)

## 2020-06-22 LAB — ECHOCARDIOGRAM COMPLETE
AR max vel: 2.26 cm2
AV Area VTI: 2.36 cm2
AV Area mean vel: 2.11 cm2
AV Mean grad: 6 mmHg
AV Peak grad: 10.9 mmHg
Ao pk vel: 1.65 m/s
Area-P 1/2: 6.65 cm2
Height: 61 in
S' Lateral: 3.8 cm
Weight: 2332.8 oz

## 2020-06-22 LAB — COMPREHENSIVE METABOLIC PANEL
ALT: 37 U/L (ref 0–44)
AST: 28 U/L (ref 15–41)
Albumin: 3.4 g/dL — ABNORMAL LOW (ref 3.5–5.0)
Alkaline Phosphatase: 131 U/L — ABNORMAL HIGH (ref 38–126)
Anion gap: 13 (ref 5–15)
BUN: 80 mg/dL — ABNORMAL HIGH (ref 8–23)
CO2: 24 mmol/L (ref 22–32)
Calcium: 8.6 mg/dL — ABNORMAL LOW (ref 8.9–10.3)
Chloride: 97 mmol/L — ABNORMAL LOW (ref 98–111)
Creatinine, Ser: 6.6 mg/dL — ABNORMAL HIGH (ref 0.44–1.00)
GFR, Estimated: 7 mL/min — ABNORMAL LOW (ref >60)
Glucose, Bld: 207 mg/dL — ABNORMAL HIGH (ref 70–99)
Potassium: 4.9 mmol/L (ref 3.5–5.1)
Sodium: 134 mmol/L — ABNORMAL LOW (ref 135–145)
Total Bilirubin: 0.9 mg/dL (ref 0.3–1.2)
Total Protein: 7.3 g/dL (ref 6.5–8.1)

## 2020-06-22 LAB — BLOOD GAS, ARTERIAL
Acid-Base Excess: 0.4 mmol/L (ref 0.0–2.0)
Bicarbonate: 25.4 mmol/L (ref 20.0–28.0)
FIO2: 100
MECHVT: 450 mL
O2 Saturation: 83 %
PEEP: 12 cmH2O
Patient temperature: 37
RATE: 16 resp/min
pCO2 arterial: 42 mmHg (ref 32.0–48.0)
pH, Arterial: 7.39 (ref 7.350–7.450)
pO2, Arterial: 48 mmHg — ABNORMAL LOW (ref 83.0–108.0)

## 2020-06-22 LAB — HEPATITIS B SURFACE ANTIGEN: Hepatitis B Surface Ag: NONREACTIVE

## 2020-06-22 LAB — HEPATITIS C ANTIBODY: HCV Ab: NONREACTIVE

## 2020-06-22 LAB — HEMOGLOBIN A1C
Hgb A1c MFr Bld: 5.3 % (ref 4.8–5.6)
Mean Plasma Glucose: 105.41 mg/dL

## 2020-06-22 LAB — CBC
HCT: 25 % — ABNORMAL LOW (ref 36.0–46.0)
Hemoglobin: 8.2 g/dL — ABNORMAL LOW (ref 12.0–15.0)
MCH: 30.9 pg (ref 26.0–34.0)
MCHC: 32.8 g/dL (ref 30.0–36.0)
MCV: 94.3 fL (ref 80.0–100.0)
Platelets: 269 10*3/uL (ref 150–400)
RBC: 2.65 MIL/uL — ABNORMAL LOW (ref 3.87–5.11)
RDW: 13.6 % (ref 11.5–15.5)
WBC: 5.7 10*3/uL (ref 4.0–10.5)
nRBC: 0 % (ref 0.0–0.2)

## 2020-06-22 LAB — HEPATITIS B CORE ANTIBODY, IGM: Hep B C IgM: NONREACTIVE

## 2020-06-22 LAB — TSH: TSH: 0.731 u[IU]/mL (ref 0.350–4.500)

## 2020-06-22 LAB — TROPONIN I (HIGH SENSITIVITY)
Troponin I (High Sensitivity): 55 ng/L — ABNORMAL HIGH (ref <18)
Troponin I (High Sensitivity): 59 ng/L — ABNORMAL HIGH (ref <18)

## 2020-06-22 LAB — HIV ANTIBODY (ROUTINE TESTING W REFLEX): HIV Screen 4th Generation wRfx: NONREACTIVE

## 2020-06-22 LAB — PHOSPHORUS: Phosphorus: 6 mg/dL — ABNORMAL HIGH (ref 2.5–4.6)

## 2020-06-22 LAB — HEPATITIS B SURFACE ANTIBODY,QUALITATIVE: Hep B S Ab: NONREACTIVE

## 2020-06-22 LAB — LACTIC ACID, PLASMA: Lactic Acid, Venous: 1.5 mmol/L (ref 0.5–1.9)

## 2020-06-22 LAB — MAGNESIUM: Magnesium: 2.9 mg/dL — ABNORMAL HIGH (ref 1.7–2.4)

## 2020-06-22 SURGERY — TEMPORARY DIALYSIS CATHETER
Anesthesia: Moderate Sedation

## 2020-06-22 MED ORDER — VECURONIUM BROMIDE 10 MG IV SOLR
10.0000 mg | Freq: Once | INTRAVENOUS | Status: AC
  Administered 2020-06-22: 10 mg via INTRAVENOUS

## 2020-06-22 MED ORDER — LORAZEPAM 2 MG/ML IJ SOLN
0.5000 mg | INTRAMUSCULAR | Status: DC | PRN
  Administered 2020-06-22: 0.5 mg via INTRAVENOUS
  Filled 2020-06-22: qty 1

## 2020-06-22 MED ORDER — PROPOFOL 1000 MG/100ML IV EMUL
0.0000 ug/kg/min | INTRAVENOUS | Status: DC
  Administered 2020-06-22: 5 ug/kg/min via INTRAVENOUS
  Administered 2020-06-23: 35 ug/kg/min via INTRAVENOUS
  Administered 2020-06-23 (×2): 15 ug/kg/min via INTRAVENOUS
  Administered 2020-06-24: 25 ug/kg/min via INTRAVENOUS
  Administered 2020-06-24 – 2020-06-25 (×3): 35 ug/kg/min via INTRAVENOUS
  Administered 2020-06-25: 34.993 ug/kg/min via INTRAVENOUS
  Administered 2020-06-25: 35 ug/kg/min via INTRAVENOUS
  Administered 2020-06-26 – 2020-06-28 (×4): 20 ug/kg/min via INTRAVENOUS
  Administered 2020-06-28 – 2020-06-29 (×2): 10 ug/kg/min via INTRAVENOUS
  Filled 2020-06-22 (×17): qty 100

## 2020-06-22 MED ORDER — FENTANYL 2500MCG IN NS 250ML (10MCG/ML) PREMIX INFUSION
0.0000 ug/h | INTRAVENOUS | Status: DC
  Administered 2020-06-22: 50 ug/h via INTRAVENOUS
  Administered 2020-06-23 – 2020-06-25 (×4): 200 ug/h via INTRAVENOUS
  Administered 2020-06-26 – 2020-06-27 (×2): 150 ug/h via INTRAVENOUS
  Administered 2020-06-29: 100 ug/h via INTRAVENOUS
  Administered 2020-06-30: 125 ug/h via INTRAVENOUS
  Filled 2020-06-22 (×11): qty 250

## 2020-06-22 MED ORDER — CHLORHEXIDINE GLUCONATE CLOTH 2 % EX PADS
6.0000 | MEDICATED_PAD | Freq: Every day | CUTANEOUS | Status: DC
  Filled 2020-06-22: qty 6

## 2020-06-22 MED ORDER — HYDROCORTISONE NA SUCCINATE PF 100 MG IJ SOLR
50.0000 mg | Freq: Four times a day (QID) | INTRAMUSCULAR | Status: DC
  Administered 2020-06-22 – 2020-06-24 (×7): 50 mg via INTRAVENOUS
  Filled 2020-06-22 (×7): qty 2

## 2020-06-22 MED ORDER — VASOPRESSIN 20 UNIT/ML IV SOLN
0.0000 [IU]/min | INTRAVENOUS | Status: DC
  Filled 2020-06-22: qty 2.5

## 2020-06-22 MED ORDER — HALOPERIDOL LACTATE 5 MG/ML IJ SOLN
5.0000 mg | Freq: Four times a day (QID) | INTRAMUSCULAR | Status: DC | PRN
  Administered 2020-06-22: 5 mg via INTRAVENOUS
  Filled 2020-06-22: qty 1

## 2020-06-22 MED ORDER — DOCUSATE SODIUM 50 MG/5ML PO LIQD
100.0000 mg | Freq: Two times a day (BID) | ORAL | Status: DC
  Administered 2020-06-23 – 2020-06-30 (×15): 100 mg
  Filled 2020-06-22 (×18): qty 10

## 2020-06-22 MED ORDER — LORAZEPAM 2 MG/ML IJ SOLN
0.5000 mg | Freq: Once | INTRAMUSCULAR | Status: AC
  Administered 2020-06-22: 0.5 mg via INTRAVENOUS
  Filled 2020-06-22: qty 1

## 2020-06-22 MED ORDER — INSULIN ASPART 100 UNIT/ML ~~LOC~~ SOLN
0.0000 [IU] | SUBCUTANEOUS | Status: DC
  Administered 2020-06-23 (×2): 2 [IU] via SUBCUTANEOUS
  Administered 2020-06-24: 3 [IU] via SUBCUTANEOUS
  Administered 2020-06-24: 5 [IU] via SUBCUTANEOUS
  Administered 2020-06-24: 3 [IU] via SUBCUTANEOUS
  Administered 2020-06-24: 2 [IU] via SUBCUTANEOUS
  Administered 2020-06-24 (×2): 5 [IU] via SUBCUTANEOUS
  Filled 2020-06-22 (×8): qty 1

## 2020-06-22 MED ORDER — KETAMINE HCL 10 MG/ML IJ SOLN
1.0000 mg/kg | Freq: Once | INTRAMUSCULAR | Status: AC
  Administered 2020-06-22: 66 mg via INTRAVENOUS

## 2020-06-22 MED ORDER — DEXMEDETOMIDINE BOLUS VIA INFUSION
0.5000 ug/kg | Freq: Once | INTRAVENOUS | Status: AC
  Administered 2020-06-22: 33.05 ug via INTRAVENOUS
  Filled 2020-06-22: qty 34

## 2020-06-22 MED ORDER — FUROSEMIDE 10 MG/ML IJ SOLN
20.0000 mg | Freq: Once | INTRAMUSCULAR | Status: AC
  Administered 2020-06-22: 20 mg via INTRAVENOUS
  Filled 2020-06-22: qty 4

## 2020-06-22 MED ORDER — KETAMINE HCL 10 MG/ML IJ SOLN
INTRAMUSCULAR | Status: AC
  Administered 2020-06-22: 60 mg via INTRAVENOUS
  Filled 2020-06-22: qty 1

## 2020-06-22 MED ORDER — SUCCINYLCHOLINE CHLORIDE 20 MG/ML IJ SOLN
100.0000 mg | Freq: Once | INTRAMUSCULAR | Status: AC
  Administered 2020-06-22: 100 mg via INTRAVENOUS

## 2020-06-22 MED ORDER — IPRATROPIUM-ALBUTEROL 0.5-2.5 (3) MG/3ML IN SOLN: 3.0000 mL | RESPIRATORY_TRACT | Status: DC | PRN

## 2020-06-22 MED ORDER — NOREPINEPHRINE 4 MG/250ML-% IV SOLN
INTRAVENOUS | Status: AC
  Administered 2020-06-22: 5 ug/kg/min
  Filled 2020-06-22: qty 250

## 2020-06-22 MED ORDER — HALOPERIDOL LACTATE 5 MG/ML IJ SOLN
2.0000 mg | Freq: Four times a day (QID) | INTRAMUSCULAR | Status: DC | PRN
  Administered 2020-06-22: 2 mg via INTRAMUSCULAR
  Filled 2020-06-22: qty 1

## 2020-06-22 MED ORDER — VECURONIUM BROMIDE 10 MG IV SOLR
INTRAVENOUS | Status: AC
  Filled 2020-06-22: qty 10

## 2020-06-22 MED ORDER — FAMOTIDINE IN NACL 20-0.9 MG/50ML-% IV SOLN
20.0000 mg | INTRAVENOUS | Status: DC
  Administered 2020-06-22 – 2020-06-29 (×8): 20 mg via INTRAVENOUS
  Filled 2020-06-22 (×9): qty 50

## 2020-06-22 MED ORDER — DEXMEDETOMIDINE HCL IN NACL 400 MCG/100ML IV SOLN
0.4000 ug/kg/h | INTRAVENOUS | Status: DC
  Administered 2020-06-22: 0.4 ug/kg/h via INTRAVENOUS
  Filled 2020-06-22: qty 100

## 2020-06-22 MED ORDER — FENTANYL BOLUS VIA INFUSION
50.0000 ug | INTRAVENOUS | Status: DC | PRN
  Administered 2020-06-29 – 2020-06-30 (×2): 50 ug via INTRAVENOUS
  Filled 2020-06-22: qty 50

## 2020-06-22 MED ORDER — LABETALOL HCL 5 MG/ML IV SOLN
20.0000 mg | INTRAVENOUS | Status: DC | PRN
  Filled 2020-06-22: qty 4

## 2020-06-22 MED ORDER — LORAZEPAM 2 MG/ML IJ SOLN
1.0000 mg | INTRAMUSCULAR | Status: DC | PRN
  Administered 2020-06-22 (×2): 1 mg via INTRAVENOUS
  Filled 2020-06-22 (×2): qty 1

## 2020-06-22 MED ORDER — FENTANYL CITRATE (PF) 100 MCG/2ML IJ SOLN
50.0000 ug | Freq: Once | INTRAMUSCULAR | Status: AC
  Administered 2020-06-22: 50 ug via INTRAVENOUS

## 2020-06-22 MED ORDER — KETAMINE HCL 10 MG/ML IJ SOLN: 60.0000 mg | Freq: Once | INTRAMUSCULAR | Status: AC

## 2020-06-22 MED ORDER — POLYETHYLENE GLYCOL 3350 17 G PO PACK
17.0000 g | PACK | Freq: Every day | ORAL | Status: DC
  Administered 2020-06-23 – 2020-06-30 (×8): 17 g
  Filled 2020-06-22 (×9): qty 1

## 2020-06-22 MED ORDER — NOREPINEPHRINE 16 MG/250ML-% IV SOLN
0.0000 ug/min | INTRAVENOUS | Status: DC
  Administered 2020-06-22: 8 ug/min via INTRAVENOUS
  Administered 2020-06-23: 5 ug/min via INTRAVENOUS
  Filled 2020-06-22: qty 250

## 2020-06-22 NOTE — Hospital Course (Signed)
Stephanie Casey is a 65 y.o. female with medical history significant for hypertension, type 2 diabetes mellitus, stage IIIb chronic kidney disease, anemia of chronic disease who presented to the ED on 07/17/2020 with 4 to 5 days of progressively worsening shortness of breath and associated b/l lower extremity edema, new productive cough, mild intermittent nausea with 2-3 episodes of emesis.  Evaluation in the ED revealed severe AKI with creatinine 6.46 (prior baseline Cr 1.7) with chest xray showing significant pulmonary edema.  She was hemodynamically stable with tachypnea and hypoxia initially requiring 3 L/min oxygen.  Admitted for further management of acute respiratory failure with hypoxia due to ? Acutely decompensated CHF and acute renal failure.

## 2020-06-22 NOTE — ED Notes (Signed)
Pt noted to have stool in bed, round soft nuggets. Pt cleansed at this time, new chux applied and moved up in bed for comfort.

## 2020-06-22 NOTE — Consult Note (Signed)
[facility]  CONSULT NOTE    Date: 07/11/2020                  Patient Name:  Stephanie Casey  MRN: [id number]  DOB: 05-18-1956  Age / Sex: 65 y.o., female         PCP: Freddy Finner, NP                 Service Requesting Consult: Dr. Eugenia Pancoast                 Reason for Consult: End stage renal disease            History of Present Illness: Stephanie Casey presents to [facility] with shortness of breath. Patient speak spanish and history taken from video interpreter. Patient was being followed by [facility]. 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 She was then lost to follow up. Patient complains of dyspnea on exertion, orthopnea, peripheral edema and wheezing. She states she is not eating and food tastes bland.    Medications: Outpatient medications: (Not in a hospital admission)   Current medications: Current Facility-Administered Medications  Medication Dose Route Frequency Provider Last Rate Last Admin  . acetaminophen (TYLENOL) tablet 650 mg  650 mg Oral Q6H PRN Howerter, Justin B, DO       Or  . acetaminophen (TYLENOL) suppository 650 mg  650 mg Rectal Q6H PRN Howerter, Justin B, DO      . aspirin EC tablet 81 mg  81 mg Oral Daily Howerter, Justin B, DO      . furosemide (LASIX) injection 40 mg  40 mg Intravenous BID Howerter, Justin B, DO   40 mg at 07/09/2020 0727  . haloperidol lactate (HALDOL) injection 5 mg  5 mg Intravenous Q6H PRN Nicole Kindred A, DO   5 mg at 06/20/2020 1120  . heparin injection 5,000 Units  5,000 Units Subcutaneous Q8H Howerter, Justin B, DO   5,000 Units at 07/10/2020 9211  . hydrALAZINE (APRESOLINE) tablet 25 mg  25 mg Oral BID Howerter, Justin B, DO   25 mg at 07/09/2020 2328  . insulin aspart (novoLOG) injection 0-9 Units  0-9 Units Subcutaneous TID WC Howerter, Justin B, DO   2 Units at 06/25/2020 0723  . labetalol (NORMODYNE) injection 20 mg  20 mg Intravenous Q3H PRN  Mansy, Jan A, MD      . LORazepam (ATIVAN) injection 1 mg  1 mg Intravenous Q4H PRN Nicole Kindred A, DO   1 mg at 06/19/2020 1022  . ondansetron (ZOFRAN) injection 4 mg  4 mg Intravenous Q6H PRN Howerter, Justin B, DO       Current Outpatient Medications  Medication Sig Dispense Refill  . amLODipine (NORVASC) 10 MG tablet Take 10 mg by mouth daily.    Marland Kitchen aspirin EC 81 MG tablet Take 81 mg by mouth daily.    . carvedilol (COREG) 12.5 MG tablet Take 12.5 mg by mouth 2 (two) times daily with a meal.    . sevelamer carbonate (RENVELA) 800 MG tablet Take 800 mg by mouth 3 (three) times daily with meals.    . traZODone (DESYREL) 50 MG tablet Take 50-100 mg by mouth at bedtime.    Marland Kitchen atenolol (TENORMIN) 100 MG tablet Take 100 mg by mouth daily. (Patient not taking: Reported on 06/27/2020)    . hydrALAZINE (APRESOLINE) 25 MG tablet Take 25 mg by mouth 2 (two)  times daily.    . insulin NPH Human (NOVOLIN N) 100 UNIT/ML injection Inject 25 Units into the skin 2 (two) times daily.        Allergies: Allergies  Allergen Reactions  . Hydrochlorothiazide     Pancreatitis      Past Medical History: Past Medical History:  Diagnosis Date  . Diabetes mellitus without complication ([place])   . Hyperlipidemia   . Hypertension      Past Surgical History: Past Surgical History:  Procedure Laterality Date  . EYE SURGERY       Family History: History reviewed. No pertinent family history.   Social History: Social History   Socioeconomic History  . Marital status: Married    Spouse name: Not on file  . Number of children: Not on file  . Years of education: Not on file  . Highest education level: Not on file  Occupational History  . Not on file  Tobacco Use  . Smoking status: Never Smoker  . Smokeless tobacco: Never Used  Substance and Sexual Activity  . Alcohol use: Never  . Drug use: Never  . Sexual activity: Not on file  Other Topics Concern  . Not on file  Social History Narrative   . Not on file   Social Determinants of Health   Financial Resource Strain: Not on file  Food Insecurity: Not on file  Transportation Needs: Not on file  Physical Activity: Not on file  Stress: Not on file  Social Connections: Not on file  Intimate Partner Violence: Not on file     Review of Systems: Review of Systems  Constitutional: Positive for malaise/fatigue and weight loss.  HENT: Negative.   Eyes: Negative.   Respiratory: Positive for cough, shortness of breath and wheezing. Negative for hemoptysis and sputum production.   Cardiovascular: Positive for orthopnea and leg swelling. Negative for chest pain, palpitations, claudication and PND.  Gastrointestinal: Negative.   Genitourinary: Negative for dysuria, flank pain, frequency, hematuria and urgency.  Musculoskeletal: Negative for back pain, falls, joint pain, myalgias and neck pain.  Skin: Negative.   Neurological: Negative.   Endo/Heme/Allergies: Negative.   Psychiatric/Behavioral: The patient is nervous/anxious.     Vital Signs: Blood pressure (!) 141/121, pulse (!) 102, temperature 98.3 F (36.8 C), temperature source Oral, resp. rate 20, height 5\' 1"  (1.549 m), weight 66.1 kg, SpO2 98 %.  Weight trends: Filed Weights   07/17/2020 1441 06/24/2020 0718  Weight: 68 kg 66.1 kg    Physical Exam: General: Critically ill  Head: +BIPAP   Eyes: Anicteric, PERRL  Neck: Supple, trachea midline  Lungs:  Bilateral wheezes and crackles  Heart: Regular rate and rhythm  Abdomen:  Soft, nontender,   Extremities: + peripheral edema.  Neurologic: Nonfocal, moving all four extremities  Skin: No lesions  Access: none     Lab results: Basic Metabolic Panel: Recent Labs  Lab 06/23/2020 1454 07/12/2020 1843 07/15/2020 0223  NA 133*  --  134*  K 4.9  --  4.9  CL 99  --  97*  CO2 23  --  24  GLUCOSE 146*  --  207*  BUN 78*  --  80*  CREATININE 6.46*  --  6.60*  CALCIUM 8.4*  --  8.6*  MG  --  3.0* 2.9*  PHOS  --   --   6.0*    Liver Function Tests: Recent Labs  Lab 06/23/2020 1454 07/06/2020 0223  AST 31 28  ALT 38 37  ALKPHOS 121  131*  BILITOT 0.8 0.9  PROT 6.9 7.3  ALBUMIN 3.3* 3.4*   No results for input(s): LIPASE, AMYLASE in the last 168 hours. No results for input(s): AMMONIA in the last 168 hours.  CBC: Recent Labs  Lab 07/02/2020 1454 07/09/2020 0223  WBC 10.1 12.4*  NEUTROABS  --  11.4*  HGB 9.9* 10.1*  HCT 29.8* 30.6*  MCV 93.4 93.9  PLT 373 399    Cardiac Enzymes: No results for input(s): CKTOTAL, CKMB, CKMBINDEX, TROPONINI in the last 168 hours.  BNP: Invalid input(s): POCBNP  CBG: Recent Labs  Lab 06/25/2020 1934 07/18/2020 2249 06/20/2020 0720 06/29/2020 1204  GLUCAP 148* 182* 199* 123*    Microbiology: Results for orders placed or performed during the hospital encounter of 07/15/2020  Resp Panel by RT-PCR (Flu A&B, Covid) Nasopharyngeal Swab     Status: None   Collection Time: 06/27/2020  6:43 PM   Specimen: Nasopharyngeal Swab; Nasopharyngeal(NP) swabs in vial transport medium  Result Value Ref Range Status   SARS Coronavirus 2 by RT PCR NEGATIVE NEGATIVE Final    Comment: (NOTE) SARS-CoV-2 target nucleic acids are NOT DETECTED.  The SARS-CoV-2 RNA is generally detectable in upper respiratory specimens during the acute phase of infection. The lowest concentration of SARS-CoV-2 viral copies this assay can detect is 138 copies/mL. A negative result does not preclude SARS-Cov-2 infection and should not be used as the sole basis for treatment or other patient management decisions. A negative result may occur with  improper specimen collection/handling, submission of specimen other than nasopharyngeal swab, presence of viral mutation(s) within the areas targeted by this assay, and inadequate number of viral copies(<138 copies/mL). A negative result must be combined with clinical observations, patient history, and epidemiological information. The expected result is  Negative.  Fact Sheet for Patients:  EntrepreneurPulse.com.au  Fact Sheet for Healthcare Providers:  IncredibleEmployment.be  This test is no t yet approved or cleared by the [place] FDA and  has been authorized for detection and/or diagnosis of SARS-CoV-2 by FDA under an Emergency Use Authorization (EUA). This EUA will remain  in effect (meaning this test can be used) for the duration of the COVID-19 declaration under Section 564(b)(1) of the Act, 21 U.S.C.section 360bbb-3(b)(1), unless the authorization is terminated  or revoked sooner.       Influenza A by PCR NEGATIVE NEGATIVE Final   Influenza B by PCR NEGATIVE NEGATIVE Final    Comment: (NOTE) The Xpert Xpress SARS-CoV-2/FLU/RSV plus assay is intended as an aid in the diagnosis of influenza from Nasopharyngeal swab specimens and should not be used as a sole basis for treatment. Nasal washings and aspirates are unacceptable for Xpert Xpress SARS-CoV-2/FLU/RSV testing.  Fact Sheet for Patients: EntrepreneurPulse.com.au  Fact Sheet for Healthcare Providers: IncredibleEmployment.be  This test is not yet approved or cleared by the [place] FDA and has been authorized for detection and/or diagnosis of SARS-CoV-2 by FDA under an Emergency Use Authorization (EUA). This EUA will remain in effect (meaning this test can be used) for the duration of the COVID-19 declaration under Section 564(b)(1) of the Act, 21 U.S.C. section 360bbb-3(b)(1), unless the authorization is terminated or revoked.  Performed at [facility], [address] [place], [place] [address]   Blood Culture (routine x 2)     Status: None (Preliminary result)   Collection Time: 06/27/2020  6:43 PM   Specimen: BLOOD  Result Value Ref Range Status   Specimen Description BLOOD RIGHT ANTECUBITAL  Final   Special  Requests   Final    BOTTLES DRAWN AEROBIC ONLY Blood  Culture adequate volume   Culture   Final    NO GROWTH < 12 HOURS Performed at [facility], [address] [place], [place] [address]    Report Status PENDING  Incomplete  Blood Culture (routine x 2)     Status: None (Preliminary result)   Collection Time: 07/16/2020  6:43 PM   Specimen: BLOOD  Result Value Ref Range Status   Specimen Description BLOOD LEFT ANTECUBITAL  Final   Special Requests   Final    BOTTLES DRAWN AEROBIC ONLY Blood Culture adequate volume   Culture   Final    NO GROWTH < 12 HOURS Performed at [facility], [address] [place], [place] [address]    Report Status PENDING  Incomplete  Resp Panel by RT-PCR (Flu A&B, Covid) Nasopharyngeal Swab     Status: None   Collection Time: 06/27/2020  9:39 PM   Specimen: Nasopharyngeal Swab; Nasopharyngeal(NP) swabs in vial transport medium  Result Value Ref Range Status   SARS Coronavirus 2 by RT PCR NEGATIVE NEGATIVE Final    Comment: (NOTE) SARS-CoV-2 target nucleic acids are NOT DETECTED.  The SARS-CoV-2 RNA is generally detectable in upper respiratory specimens during the acute phase of infection. The lowest concentration of SARS-CoV-2 viral copies this assay can detect is 138 copies/mL. A negative result does not preclude SARS-Cov-2 infection and should not be used as the sole basis for treatment or other patient management decisions. A negative result may occur with  improper specimen collection/handling, submission of specimen other than nasopharyngeal swab, presence of viral mutation(s) within the areas targeted by this assay, and inadequate number of viral copies(<138 copies/mL). A negative result must be combined with clinical observations, patient history, and epidemiological information. The expected result is Negative.  Fact Sheet for Patients:  EntrepreneurPulse.com.au  Fact Sheet for Healthcare Providers:  IncredibleEmployment.be  This test is  no t yet approved or cleared by the [place] FDA and  has been authorized for detection and/or diagnosis of SARS-CoV-2 by FDA under an Emergency Use Authorization (EUA). This EUA will remain  in effect (meaning this test can be used) for the duration of the COVID-19 declaration under Section 564(b)(1) of the Act, 21 U.S.C.section 360bbb-3(b)(1), unless the authorization is terminated  or revoked sooner.       Influenza A by PCR NEGATIVE NEGATIVE Final   Influenza B by PCR NEGATIVE NEGATIVE Final    Comment: (NOTE) The Xpert Xpress SARS-CoV-2/FLU/RSV plus assay is intended as an aid in the diagnosis of influenza from Nasopharyngeal swab specimens and should not be used as a sole basis for treatment. Nasal washings and aspirates are unacceptable for Xpert Xpress SARS-CoV-2/FLU/RSV testing.  Fact Sheet for Patients: EntrepreneurPulse.com.au  Fact Sheet for Healthcare Providers: IncredibleEmployment.be  This test is not yet approved or cleared by the [place] FDA and has been authorized for detection and/or diagnosis of SARS-CoV-2 by FDA under an Emergency Use Authorization (EUA). This EUA will remain in effect (meaning this test can be used) for the duration of the COVID-19 declaration under Section 564(b)(1) of the Act, 21 U.S.C. section 360bbb-3(b)(1), unless the authorization is terminated or revoked.  Performed at [facility], [address] [place], [place] [address]     Coagulation Studies: No results for input(s): LABPROT, INR in the last 72 hours.  Urinalysis: Recent Labs    07/10/2020 2105  COLORURINE YELLOW*  LABSPEC 1.010  PHURINE 5.0  GLUCOSEU NEGATIVE  HGBUR NEGATIVE  BILIRUBINUR NEGATIVE  KETONESUR NEGATIVE  PROTEINUR >=300*  NITRITE NEGATIVE  LEUKOCYTESUR NEGATIVE      Imaging: DG Chest 1 View  Result Date: 07/17/2020 CLINICAL DATA:  Short of breath EXAM: CHEST  1 VIEW COMPARISON:   06/23/2020 FINDINGS: Single frontal view of the chest demonstrates persistent enlargement the cardiac silhouette. Worsening bilateral perihilar airspace disease. Small effusions have enlarged. No pneumothorax. IMPRESSION: 1. Findings consistent with progressive congestive heart failure and pulmonary edema. Electronically Signed   By: Randa Ngo M.D.   On: 06/25/2020 03:59   DG Chest 2 View  Result Date: 07/13/2020 CLINICAL DATA:  Chest pressure, shortness of breath and intermittent nausea. EXAM: CHEST - 2 VIEW COMPARISON:  June 26, 2010 FINDINGS: Moderate to marked severity predominantly bilateral perihilar, bilateral suprahilar and bilateral infrahilar infiltrates are seen. Extension to involve the bilateral lung bases is also noted. There are small bilateral pleural effusions. No pneumothorax is identified. The cardiac silhouette is moderately enlarged. The visualized skeletal structures are unremarkable. IMPRESSION: 1. Moderate to marked severity bilateral infiltrates. 2. Small bilateral pleural effusions. Electronically Signed   By: Virgina Norfolk M.D.   On: 07/05/2020 15:28   US Renal  Result Date: 06/25/2020 CLINICAL DATA:  Acute renal failure. EXAM: RENAL / URINARY TRACT ULTRASOUND COMPLETE COMPARISON:  Ultrasound abdomen 12/29/2012, ultrasound renal 04/25/1999 FINDINGS: Right Kidney: Renal measurements: 7.4 x 3 x 4.1 cm = volume: 85 mL. Echogenicity is increased. No mass or hydronephrosis visualized. Left Kidney: Renal measurements: 8.8 x 4.6 x 4.3 cm = volume: 91 mL. Echogenicity is increased. Underlying mass within the left kidney cannot be excluded. Urinary bladder: Appears normal for degree of bladder distention. Other: None. IMPRESSION: Atrophic and echogenic bilateral kidneys suggesting renal parenchymal disease with an underlying mass within the left kidney not excluded. Recommend MRI or CT renal protocol for further evaluation. Electronically Signed   By: Iven Finn M.D.   On:  07/10/2020 21:23      Assessment & Plan: Stephanie Casey is a 59 y.o. 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 Acute kidney injury on chronic kidney disease stage V versus end stage renal disease from progression of kidney disease.  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  Followed by [facility].   2. Acute respiratory failure with pulmonary edema requring noninvasive ventilation.   3. Anemia with chronic kidney disease  Plan  Consult vascular Patient needs emergent hemodialysis initiation.      LOS: 1 Stephanie Casey 1/4/202212:26 PM

## 2020-06-22 NOTE — Consult Note (Signed)
[facility] Vascular Consult Note  MRN : [id number]  Stephanie Casey is a 65 y.o. (12-17-1955) female who presents with chief complaint of  Chief Complaint  Patient presents with  . Chest Pain  . Shortness of Breath   History of Present Illness:  The patient has a past medical history of HTN, HDL, and DM who presents for assessment of approximately 5 days of worsening cough, shortness of breath, chest tightness as well as nonbloody nonbilious vomiting, decreased appetite, fatigue and myalgias. She denies any headache, earache, sore throat, urinary symptoms, abdominal pain, back pain, rash or focal extremity pain.  No recent falls or injuries.  She denies any EtOH use, illicit drug use, tobacco abuse. She denies any change in her urine output but does note she has had very little to eat or drink for the last couple days.  No other acute concerns at this time.  The nephrology service has decided to initiate dialysis at this time, and we are asked to place a temporary dialysis catheter (Dr. Arbutus Ped consulted) for immediate dialysis use.    Of note: Patient with altered mental status.  Patient was given Haldol and Ativan and was unable to participate in consult.  Information was taken from previous epic notation and clinical team members.  Current Facility-Administered Medications  Medication Dose Route Frequency Provider Last Rate Last Admin  . acetaminophen (TYLENOL) tablet 650 mg  650 mg Oral Q6H PRN Howerter, Justin B, DO       Or  . acetaminophen (TYLENOL) suppository 650 mg  650 mg Rectal Q6H PRN Howerter, Justin B, DO      . aspirin EC tablet 81 mg  81 mg Oral Daily Howerter, Justin B, DO      . dexmedetomidine (PRECEDEX) 400 MCG/100ML (4 mcg/mL) infusion  0.4-0.8 mcg/kg/hr Intravenous Titrated Nicole Kindred A, DO 6.61 mL/hr at 06/24/2020 1702 0.4 mcg/kg/hr at 07/04/2020 1702  . furosemide (LASIX) injection 40 mg  40 mg Intravenous BID Howerter, Justin B, DO    40 mg at 07/09/2020 0727  . haloperidol lactate (HALDOL) injection 5 mg  5 mg Intravenous Q6H PRN Nicole Kindred A, DO   5 mg at 07/07/2020 1120  . heparin injection 5,000 Units  5,000 Units Subcutaneous Q8H Howerter, Justin B, DO   5,000 Units at 06/29/2020 1418  . hydrALAZINE (APRESOLINE) tablet 25 mg  25 mg Oral BID Howerter, Justin B, DO   25 mg at 06/27/2020 2328  . insulin aspart (novoLOG) injection 0-9 Units  0-9 Units Subcutaneous TID WC Howerter, Justin B, DO   1 Units at 07/17/2020 1237  . labetalol (NORMODYNE) injection 20 mg  20 mg Intravenous Q3H PRN Mansy, Jan A, MD      . LORazepam (ATIVAN) injection 1 mg  1 mg Intravenous Q4H PRN Nicole Kindred A, DO   1 mg at 07/03/2020 1557  . ondansetron (ZOFRAN) injection 4 mg  4 mg Intravenous Q6H PRN Howerter, Justin B, DO       Current Outpatient Medications  Medication Sig Dispense Refill  . aspirin EC 81 MG tablet Take 81 mg by mouth daily.    . hydrALAZINE (APRESOLINE) 25 MG tablet Take 25 mg by mouth 2 (two) times daily.    . insulin NPH Human (NOVOLIN N) 100 UNIT/ML injection Inject 25 Units into the skin 2 (two) times daily.    . sevelamer carbonate (RENVELA) 800 MG tablet Take 800 mg by mouth 3 (three) times daily with meals.    Marland Kitchen  traZODone (DESYREL) 50 MG tablet Take 50-100 mg by mouth at bedtime.    Marland Kitchen acetaminophen (TYLENOL) 325 MG tablet Take 2 tablets by mouth every 6 (six) hours as needed.    Marland Kitchen atenolol (TENORMIN) 100 MG tablet Take 100 mg by mouth daily. (Patient not taking: No sig reported)     Past Medical History:  Diagnosis Date  . Diabetes mellitus without complication ([place])   . Hyperlipidemia   . Hypertension    Past Surgical History:  Procedure Laterality Date  . EYE SURGERY     Social History Social History   Tobacco Use  . Smoking status: Never Smoker  . Smokeless tobacco: Never Used  Substance Use Topics  . Alcohol use: Never  . Drug use: Never   Family History History reviewed. No pertinent family  history.    Allergies  Allergen Reactions  . Hydrochlorothiazide     Pancreatitis   REVIEW OF SYSTEMS (Negative unless checked)  Constitutional: [] Weight loss  [] Fever  [] Chills Cardiac: [] Chest pain   [] Chest pressure   [] Palpitations   [] Shortness of breath when laying flat   [] Shortness of breath at rest   [] Shortness of breath with exertion. Vascular:  [] Pain in legs with walking   [] Pain in legs at rest   [] Pain in legs when laying flat   [] Claudication   [] Pain in feet when walking  [] Pain in feet at rest  [] Pain in feet when laying flat   [] History of DVT   [] Phlebitis   [] Swelling in legs   [] Varicose veins   [] Non-healing ulcers Pulmonary:   [] Uses home oxygen   [] Productive cough   [] Hemoptysis   [] Wheeze  [] COPD   [] Asthma Neurologic:  [] Dizziness  [] Blackouts   [] Seizures   [] History of stroke   [] History of TIA  [] Aphasia   [] Temporary blindness   [] Dysphagia   [] Weakness or numbness in arms   [] Weakness or numbness in legs Musculoskeletal:  [] Arthritis   [] Joint swelling   [] Joint pain   [] Low back pain Hematologic:  [] Easy bruising  [] Easy bleeding   [] Hypercoagulable state   [] Anemic  [] Hepatitis Gastrointestinal:  [] Blood in stool   [] Vomiting blood  [] Gastroesophageal reflux/heartburn   [] Difficulty swallowing. Genitourinary:  [] Chronic kidney disease   [] Difficult urination  [] Frequent urination  [] Burning with urination   [] Blood in urine Skin:  [] Rashes   [] Ulcers   [] Wounds Psychological:  [] History of anxiety   []  History of major depression.  Unable to obtain the review of systems due to the patient's severe systemic illness and altered mental status.    Physical Examination  Vitals:   07/16/2020 1400 06/23/2020 1430 07/18/2020 1706 06/29/2020 1712  BP: (!) 164/111 133/86 (!) 136/110 (!) 133/93  Pulse: (!) 102 (!) 104 (!) 102 95  Resp: (!) 25 (!) 32 (!) 23 19  Temp:      TempSrc:      SpO2: 98% 100%  96%  Weight:      Height:       Body mass index is 27.55  kg/m. Gen: Lethargic Head: [place]/AT, No temporalis wasting Ear/Nose/Throat: Hearing grossly intact, nares w/o erythema or drainage Eyes: Sclera non-icteric, conjunctiva clear Neck: Supple, no nuchal rigidity.  No JVD.  Pulmonary:  Good air movement, clear to auscultation bilaterally.  Cardiac: RRR, normal S1, S2, no Murmurs, rubs or gallops. Vascular: Remedies warm distally Gastrointestinal: soft, non-tender/non-distended. No guarding/reflex.  Musculoskeletal: M/S 5/5 throughout.  Extremities without ischemic changes.  No deformity or atrophy. mild Edema  in the lower extremities bilaterally Neurologic: Altered mental status Psychiatric: Difficult to assess due to the severity of patient's illness. Dermatologic: No rashes or ulcers noted.   Lymph : No Cervical, Axillary, or Inguinal lymphadenopathy.  CBC Lab Results  Component Value Date   WBC 12.4 (H) 07/12/2020   HGB 10.1 (L) 07/09/2020   HCT 30.6 (L) 07/11/2020   MCV 93.9 07/03/2020   PLT 399 07/19/2020   BMET    Component Value Date/Time   NA 134 (L) 07/17/2020 0223   NA 136 01/15/2013 2354   K 4.9 06/24/2020 0223   K 3.7 01/15/2013 2354   CL 97 (L) 07/13/2020 0223   CL 94 (L) 01/15/2013 2354   CO2 24 07/05/2020 0223   CO2 35 (H) 01/15/2013 2354   GLUCOSE 207 (H) 07/13/2020 0223   GLUCOSE 176 (H) 01/15/2013 2354   BUN 80 (H) 06/24/2020 0223   BUN 16 01/15/2013 2354   CREATININE 6.60 (H) 07/09/2020 0223   CREATININE 1.02 01/15/2013 2354   CALCIUM 8.6 (L) 07/11/2020 0223   CALCIUM 9.1 01/15/2013 2354   GFRNONAA 7 (L) 07/19/2020 0223   GFRNONAA >60 01/15/2013 2354   GFRAA 37 (L) 02/10/2019 0439   GFRAA >60 01/15/2013 2354   Estimated Creatinine Clearance: 7.6 mL/min (A) (by C-G formula based on SCr of 6.6 mg/dL (H)).  COAG No results found for: INR, PROTIME  Radiology DG Chest 1 View  Result Date: 07/07/2020 CLINICAL DATA:  Short of breath EXAM: CHEST  1 VIEW COMPARISON:  06/26/2020 FINDINGS: Single frontal view  of the chest demonstrates persistent enlargement the cardiac silhouette. Worsening bilateral perihilar airspace disease. Small effusions have enlarged. No pneumothorax. IMPRESSION: 1. Findings consistent with progressive congestive heart failure and pulmonary edema. Electronically Signed   By: Randa Ngo M.D.   On: 07/09/2020 03:59   DG Chest 2 View  Result Date: 07/01/2020 CLINICAL DATA:  Chest pressure, shortness of breath and intermittent nausea. EXAM: CHEST - 2 VIEW COMPARISON:  June 26, 2010 FINDINGS: Moderate to marked severity predominantly bilateral perihilar, bilateral suprahilar and bilateral infrahilar infiltrates are seen. Extension to involve the bilateral lung bases is also noted. There are small bilateral pleural effusions. No pneumothorax is identified. The cardiac silhouette is moderately enlarged. The visualized skeletal structures are unremarkable. IMPRESSION: 1. Moderate to marked severity bilateral infiltrates. 2. Small bilateral pleural effusions. Electronically Signed   By: Virgina Norfolk M.D.   On: 07/08/2020 15:28   US Renal  Result Date: 06/23/2020 CLINICAL DATA:  Acute renal failure. EXAM: RENAL / URINARY TRACT ULTRASOUND COMPLETE COMPARISON:  Ultrasound abdomen 12/29/2012, ultrasound renal 04/25/1999 FINDINGS: Right Kidney: Renal measurements: 7.4 x 3 x 4.1 cm = volume: 85 mL. Echogenicity is increased. No mass or hydronephrosis visualized. Left Kidney: Renal measurements: 8.8 x 4.6 x 4.3 cm = volume: 91 mL. Echogenicity is increased. Underlying mass within the left kidney cannot be excluded. Urinary bladder: Appears normal for degree of bladder distention. Other: None. IMPRESSION: Atrophic and echogenic bilateral kidneys suggesting renal parenchymal disease with an underlying mass within the left kidney not excluded. Recommend MRI or CT renal protocol for further evaluation. Electronically Signed   By: Iven Finn M.D.   On: 07/06/2020 21:23   ECHOCARDIOGRAM  COMPLETE  Result Date: 06/25/2020    ECHOCARDIOGRAM REPORT   Patient Name:   Stephanie Casey Date of Exam: 07/04/2020 Medical Rec #:  [id number]             Height:  61.0 in Accession #:    [id number]            Weight:       145.8 lb Date of Birth:  03/31/1956            BSA:          1.651 m Patient Age:    58 years              BP:           113/96 mmHg Patient Gender: F                     HR:           99 bpm. Exam Location:  [facility] Procedure: 2D Echo, Color Doppler and Cardiac Doppler Indications:     I50.9 Congestive Heart Failure  History:         Patient has no prior history of Echocardiogram examinations.                  Risk Factors:Hypertension, Diabetes and Dyslipidemia.  Sonographer:     Charmayne Sheer RDCS (AE) Referring Phys:  [id number] Rhetta Mura Diagnosing Phys: Nelva Bush MD  Sonographer Comments: Image acquisition challenging due to patient behavioral factors., Image acquisition challenging due to uncooperative patient and Image acquisition challenging due to respiratory motion. IMPRESSIONS  1. Left ventricular ejection fraction, by estimation, is 40 to 45%. The left ventricle has mildly decreased function. The left ventricle demonstrates global hypokinesis. The left ventricular internal cavity size was mildly dilated. There is mild left ventricular hypertrophy. Left ventricular diastolic parameters are indeterminate.  2. Right ventricular systolic function is normal. The right ventricular size is mildly enlarged. Mildly increased right ventricular wall thickness. There is moderately elevated pulmonary artery systolic pressure.  3. Left atrial size was mild to moderately dilated.  4. Right atrial size was moderately dilated.  5. A small pericardial effusion is present. The pericardial effusion is circumferential.  6. The mitral valve is abnormal. Mild to moderate mitral valve regurgitation. No evidence of mitral stenosis.  7. The tricuspid valve is degenerative. Tricuspid valve  regurgitation is severe.  8. The aortic valve has an indeterminant number of cusps. There is mild thickening of the aortic valve. Aortic valve regurgitation is not visualized. No aortic stenosis is present.  9. The inferior vena cava is dilated in size with <50% respiratory variability, suggesting right atrial pressure of 15 mmHg. FINDINGS  Left Ventricle: Left ventricular ejection fraction, by estimation, is 40 to 45%. The left ventricle has mildly decreased function. The left ventricle demonstrates global hypokinesis. The left ventricular internal cavity size was mildly dilated. There is  mild left ventricular hypertrophy. Left ventricular diastolic parameters are indeterminate. Right Ventricle: The right ventricular size is mildly enlarged. Mildly increased right ventricular wall thickness. Right ventricular systolic function is normal. There is moderately elevated pulmonary artery systolic pressure. The tricuspid regurgitant velocity is 3.09 m/s, and with an assumed right atrial pressure of 15 mmHg, the estimated right ventricular systolic pressure is 30.8 mmHg. Left Atrium: Left atrial size was mild to moderately dilated. Right Atrium: Right atrial size was moderately dilated. Pericardium: A small pericardial effusion is present. The pericardial effusion is circumferential. Mitral Valve: The mitral valve is abnormal. There is mild thickening of the mitral valve leaflet(s). Mild to moderate mitral valve regurgitation. No evidence of mitral valve stenosis. MV peak gradient, 6.2 mmHg. The mean mitral valve gradient is 4.0 mmHg. Tricuspid Valve:  The tricuspid valve is degenerative in appearance. Tricuspid valve regurgitation is severe. Aortic Valve: The aortic valve has an indeterminant number of cusps. There is mild thickening of the aortic valve. Aortic valve regurgitation is not visualized. No aortic stenosis is present. Aortic valve mean gradient measures 6.0 mmHg. Aortic valve peak gradient measures 10.9 mmHg.  Aortic valve area, by VTI measures 2.36 cm. Pulmonic Valve: The pulmonic valve was grossly normal. Pulmonic valve regurgitation is trivial. No evidence of pulmonic stenosis. Aorta: The aortic root is normal in size and structure. Pulmonary Artery: The pulmonary artery is of normal size. Venous: The inferior vena cava is dilated in size with less than 50% respiratory variability, suggesting right atrial pressure of 15 mmHg. IAS/Shunts: The interatrial septum was not well visualized. Additional Comments: There is pleural effusion in the left lateral region.  LEFT VENTRICLE PLAX 2D LVIDd:         5.40 cm  Diastology LVIDs:         3.80 cm  LV e' lateral:   8.81 cm/s LV PW:         1.20 cm  LV E/e' lateral: 15.0 LV IVS:        1.18 cm LVOT diam:     2.20 cm LV SV:         71 LV SV Index:   43 LVOT Area:     3.80 cm  RIGHT VENTRICLE RV Basal diam:  4.10 cm LEFT ATRIUM           Index       RIGHT ATRIUM           Index LA diam:      4.80 cm 2.91 cm/m  RA Area:     25.90 cm LA Vol (A4C): 89.7 ml 54.32 ml/m RA Volume:   90.90 ml  55.05 ml/m  AORTIC VALVE                    PULMONIC VALVE AV Area (Vmax):    2.26 cm     PV Vmax:       1.26 m/s AV Area (Vmean):   2.11 cm     PV Vmean:      79.200 cm/s AV Area (VTI):     2.36 cm     PV VTI:        0.202 m AV Vmax:           165.00 cm/s  PV Peak grad:  6.4 mmHg AV Vmean:          114.000 cm/s PV Mean grad:  3.0 mmHg AV VTI:            0.300 m AV Peak Grad:      10.9 mmHg AV Mean Grad:      6.0 mmHg LVOT Vmax:         97.90 cm/s LVOT Vmean:        63.200 cm/s LVOT VTI:          0.186 m LVOT/AV VTI ratio: 0.62  AORTA Ao Root diam: 3.20 cm MITRAL VALVE                TRICUSPID VALVE MV Area (PHT): 6.65 cm     TR Peak grad:   38.2 mmHg MV Peak grad:  6.2 mmHg     TR Vmax:        309.00 cm/s MV Mean grad:  4.0 mmHg MV Vmax:  1.25 m/s     SHUNTS MV Vmean:      91.3 cm/s    Systemic VTI:  0.19 m MV Decel Time: 114 msec     Systemic Diam: 2.20 cm MV E velocity: 132.00  cm/s MV A velocity: 106.00 cm/s MV E/A ratio:  1.25 Nelva Bush MD Electronically signed by Nelva Bush MD Signature Date/Time: 07/04/2020/1:02:18 PM    Final    Assessment/Plan The patient is a 65 year old female with multiple medical issues including acute on chronic renal failure with altered mental status  1.  Acute on chronic renal failure: Patient presents with acute on chronic renal failure.  Unfortunately, the patient is experiencing altered mental status and I was unable to obtain consent for a temporary dialysis catheter.  Speaking with the clinical team, it has been deemed an emergency (spoke personally with Dr. Juleen China and Dr. Arbutus Ped). We will proceed with temporary dialysis catheter placement at this time. If the patient's renal function does not improve throughout the hospital course, we will be happy to place a tunneled dialysis catheter for long term use prior to discharge.    Discussed with Dr. Francene Castle, PA-C  07/13/2020 6:24 PM

## 2020-06-22 NOTE — ED Notes (Signed)
Pt BP increasing rapidly now that dialysis is complete. NP aware. See mar

## 2020-06-22 NOTE — Progress Notes (Signed)
PROGRESS NOTE    Stephanie Casey   ZSW:[id number]  DOB: 02-23-1956  PCP: Freddy Finner, NP    DOA: 07/05/2020 LOS: 1   Brief Narrative   Stephanie Casey is a 65 y.o. female with medical history significant for hypertension, type 2 diabetes mellitus, stage IIIb chronic kidney disease, anemia of chronic disease who presented to the ED on 06/27/2020 with 4 to 5 days of progressively worsening shortness of breath and associated b/l lower extremity edema, new productive cough, mild intermittent nausea with 2-3 episodes of emesis.  Evaluation in the ED revealed severe AKI with creatinine 6.46 (prior baseline Cr 1.7) with chest xray showing significant pulmonary edema.  She was hemodynamically stable with tachypnea and hypoxia initially requiring 3 L/min oxygen.  Admitted for further management of acute respiratory failure with hypoxia due to ? Acutely decompensated CHF and acute renal failure.       Assessment & Plan   Principal Problem:   Acute respiratory failure with hypoxia (HCC) Active Problems:   Acute heart failure with preserved ejection fraction (HFpEF) (HCC)   Hypertension   Diabetes mellitus without complication (HCC)   ARF (acute renal failure) (HCC)   SOB (shortness of breath)   Elevated troponin   Acute respiratory failure with hypoxia secondary to volume overload with severe pulmonary edema -patient with significant respiratory distress on admission and has continued today.  Requiring BiPAP to support increased work of breathing. 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 --Nephrology is following --Patient needs emergent dialysis today, vascular to place temporary HD cath today --Continue IV Lasix 40 mg twice daily  Acute metabolic encephalopathy -patient with confusion and agitation, attempting to get out of bed and take off BiPAP continuously.  Requiring sitter at bedside for safety.  Noted to quickly desat into the 70s when BiPAP mask is removed.  Little improvement with IV Ativan.  IV Haldol was more helpful per bedside nurse. --Continue IV Haldol as needed but monitor QTC with serial EKGs  Elevated troponin, mild -demand ischemia in the setting of hypoxia.  Patient was without chest pain or acute ischemic changes on EKG.  Trend is flat.  Continue aspirin.    Type 2 diabetes -patient takes NPH 25 units twice a day at home.  Hold NPH insulin.  Sliding scale NovoLog for now.  Hypertension -Home regimen is amlodipine, atenolol and hydralazine.  Patient normotensive on admission.  Hydralazine continued.  Atenolol and Norvasc held for now with diuresis.  Monitor BP and resume meds when indicated.    DVT prophylaxis: heparin injection 5,000 Units Start: 07/16/2020 2200   Diet:  Diet Orders (From admission, onward)    Start     Ordered   06/29/2020 1959  Diet heart healthy/carb modified Room service appropriate? Yes; Fluid consistency: Thin  Diet effective now       Question Answer Comment  Diet-HS Snack? Nothing   Room service appropriate? Yes   Fluid consistency: Thin      06/20/2020 1959            Code Status: Full Code    Subjective 06/27/2020  Patient seen this morning in the ED while holding for a bed.  Patient is restless and agitated trying to pull off BiPAP mask.  Spanish interpreter on iPad in the room used to communicate with patient.  Patient was initially refusing the idea of dialysis but when asked if it was necessary to save her life she agreed.   Disposition Plan & Communication   Status is: Inpatient  Remains inpatient  appropriate because:Inpatient level of care appropriate due to severity of illness as outlined above  Dispo: The patient is from: Home              Anticipated d/c is to: Home              Anticipated d/c date is: 3 days              Patient currently is not medically stable to d/c.   Family Communication: None at bedside, will attempt to call   Consults, Procedures, Significant Events   Consultants:   Nephrology  Vascular surgery  Procedures:   Temporary dialysis catheter to be placed today 1/4  Antimicrobials:  Anti-infectives (From admission, onward)   None        Objective   Vitals:   07/13/2020 0700 07/03/2020 0718 07/11/2020 0729 07/11/2020 0730  BP: 97/71   (!) 155/91  Pulse: 98  (!) 102 100  Resp:   (!) 25 (!) 29  Temp:      TempSrc:      SpO2: 99%  99% 99%  Weight:  66.1 kg    Height:       No intake or output data in the 24 hours ending 06/28/2020 0803 Filed Weights   06/20/2020 1441 07/02/2020 0718  Weight: 68 kg 66.1 kg    Physical Exam:  General exam: awake, alert, restless and agitated HEENT: On BiPAP, clear conjunctiva, anicteric sclera, hearing grossly normal  Respiratory system: Shallow inspirations and diffuse rhonchi, increased respiratory effort with accessory muscle use, on BiPAP Cardiovascular system: normal S1/S2, RRR, no pedal edema.   Gastrointestinal system: soft, NT, ND Central nervous system: no gross focal neurologic deficits, CNs 2 through 12 grossly intact Extremities: moves all, no edema, normal tone    Labs   Data Reviewed: I have personally reviewed following labs and imaging studies  CBC: Recent Labs  Lab 07/12/2020 1454 07/09/2020 0223  WBC 10.1 12.4*  NEUTROABS  --  11.4*  HGB 9.9* 10.1*  HCT 29.8* 30.6*  MCV 93.4 93.9  PLT 373 485   Basic Metabolic Panel: Recent Labs  Lab 07/06/2020 1454 06/27/2020 1843 06/26/2020 0223  NA 133*  --  134*  K 4.9  --  4.9  CL 99  --  97*  CO2 23  --  24  GLUCOSE 146*  --  207*  BUN 78*   --  80*  CREATININE 6.46*  --  6.60*  CALCIUM 8.4*  --  8.6*  MG  --  3.0* 2.9*  PHOS  --   --  6.0*   GFR: Estimated Creatinine Clearance: 7.6 mL/min (A) (by C-G formula based on SCr of 6.6 mg/dL (H)). Liver Function Tests: Recent Labs  Lab 07/13/2020 1454 07/19/2020 0223  AST 31 28  ALT 38 37  ALKPHOS 121 131*  BILITOT 0.8 0.9  PROT 6.9 7.3  ALBUMIN 3.3* 3.4*   No results for input(s): LIPASE, AMYLASE in the last 168 hours. No results for input(s): AMMONIA in the last 168 hours. Coagulation Profile: No  results for input(s): INR, PROTIME in the last 168 hours. Cardiac Enzymes: No results for input(s): CKTOTAL, CKMB, CKMBINDEX, TROPONINI in the last 168 hours. BNP (last 3 results) No results for input(s): PROBNP in the last 8760 hours. HbA1C: No results for input(s): HGBA1C in the last 72 hours. CBG: Recent Labs  Lab 06/27/2020 1934 07/12/2020 2249 07/17/2020 0720  GLUCAP 148* 182* 199*   Lipid Profile: Recent Labs    07/07/2020 1454  TRIG 50   Thyroid Function Tests: Recent Labs    06/22/2020 0223  TSH 0.731   Anemia Panel: Recent Labs    06/26/2020 1454  FERRITIN 183   Sepsis Labs: Recent Labs  Lab 07/19/2020 0222 06/25/2020 1454 07/08/2020 1843  PROCALCITON  --  <0.10  --   LATICACIDVEN 1.5  --  0.9    Recent Results (from the past 240 hour(s))  Resp Panel by RT-PCR (Flu A&B, Covid) Nasopharyngeal Swab     Status: None   Collection Time: 07/17/2020  6:43 PM   Specimen: Nasopharyngeal Swab; Nasopharyngeal(NP) swabs in vial transport medium  Result Value Ref Range Status   SARS Coronavirus 2 by RT PCR NEGATIVE NEGATIVE Final    Comment: (NOTE) SARS-CoV-2 target nucleic acids are NOT DETECTED.  The SARS-CoV-2 RNA is generally detectable in upper respiratory specimens during the acute phase of infection. The lowest concentration of SARS-CoV-2 viral copies this assay can detect is 138 copies/mL. A negative result does not preclude SARS-Cov-2 infection and should  not be used as the sole basis for treatment or other patient management decisions. A negative result may occur with  improper specimen collection/handling, submission of specimen other than nasopharyngeal swab, presence of viral mutation(s) within the areas targeted by this assay, and inadequate number of viral copies(<138 copies/mL). A negative result must be combined with clinical observations, patient history, and epidemiological information. The expected result is Negative.  Fact Sheet for Patients:  EntrepreneurPulse.com.au  Fact Sheet for Healthcare Providers:  IncredibleEmployment.be  This test is no t yet approved or cleared by the [place] FDA and  has been authorized for detection and/or diagnosis of SARS-CoV-2 by FDA under an Emergency Use Authorization (EUA). This EUA will remain  in effect (meaning this test can be used) for the duration of the COVID-19 declaration under Section 564(b)(1) of the Act, 21 U.S.C.section 360bbb-3(b)(1), unless the authorization is terminated  or revoked sooner.       Influenza A by PCR NEGATIVE NEGATIVE Final   Influenza B by PCR NEGATIVE NEGATIVE Final    Comment: (NOTE) The Xpert Xpress SARS-CoV-2/FLU/RSV plus assay is intended as an aid in the diagnosis of influenza from Nasopharyngeal swab specimens and should not be used as a sole basis for treatment. Nasal washings and aspirates are unacceptable for Xpert Xpress SARS-CoV-2/FLU/RSV testing.  Fact Sheet for Patients: EntrepreneurPulse.com.au  Fact Sheet for Healthcare Providers: IncredibleEmployment.be  This test is not yet approved or cleared by the [place] FDA and has been authorized for detection and/or diagnosis of SARS-CoV-2 by FDA under an Emergency Use Authorization (EUA). This EUA will remain in effect (meaning this test can be used) for the duration of the COVID-19 declaration under  Section 564(b)(1) of the Act, 21 U.S.C. section 360bbb-3(b)(1), unless the authorization is terminated or revoked.  Performed at [facility], [address] [place],  [address]   Blood Culture (routine x 2)     Status: None (Preliminary result)   Collection Time: 07/14/2020  6:43 PM   Specimen:  BLOOD  Result Value Ref Range Status   Specimen Description BLOOD RIGHT ANTECUBITAL  Final   Special Requests   Final    BOTTLES DRAWN AEROBIC ONLY Blood Culture adequate volume   Culture   Final    NO GROWTH < 12 HOURS Performed at [facility], [address] [place], [place] [address]    Report Status PENDING  Incomplete  Blood Culture (routine x 2)     Status: None (Preliminary result)   Collection Time: 06/20/2020  6:43 PM   Specimen: BLOOD  Result Value Ref Range Status   Specimen Description BLOOD LEFT ANTECUBITAL  Final   Special Requests   Final    BOTTLES DRAWN AEROBIC ONLY Blood Culture adequate volume   Culture   Final    NO GROWTH < 12 HOURS Performed at [facility], [address], [place], [place] [address]    Report Status PENDING  Incomplete  Resp Panel by RT-PCR (Flu A&B, Covid) Nasopharyngeal Swab     Status: None   Collection Time: 07/07/2020  9:39 PM   Specimen: Nasopharyngeal Swab; Nasopharyngeal(NP) swabs in vial transport medium  Result Value Ref Range Status   SARS Coronavirus 2 by RT PCR NEGATIVE NEGATIVE Final    Comment: (NOTE) SARS-CoV-2 target nucleic acids are NOT DETECTED.  The SARS-CoV-2 RNA is generally detectable in upper respiratory specimens during the acute phase of infection. The lowest concentration of SARS-CoV-2 viral copies this assay can detect is 138 copies/mL. A negative result does not preclude SARS-Cov-2 infection and should not be used as the sole basis for treatment or other patient management decisions. A negative result may occur with  improper specimen collection/handling, submission of specimen  other than nasopharyngeal swab, presence of viral mutation(s) within the areas targeted by this assay, and inadequate number of viral copies(<138 copies/mL). A negative result must be combined with clinical observations, patient history, and epidemiological information. The expected result is Negative.  Fact Sheet for Patients:  EntrepreneurPulse.com.au  Fact Sheet for Healthcare Providers:  IncredibleEmployment.be  This test is no t yet approved or cleared by the [place] FDA and  has been authorized for detection and/or diagnosis of SARS-CoV-2 by FDA under an Emergency Use Authorization (EUA). This EUA will remain  in effect (meaning this test can be used) for the duration of the COVID-19 declaration under Section 564(b)(1) of the Act, 21 U.S.C.section 360bbb-3(b)(1), unless the authorization is terminated  or revoked sooner.       Influenza A by PCR NEGATIVE NEGATIVE Final   Influenza B by PCR NEGATIVE NEGATIVE Final    Comment: (NOTE) The Xpert Xpress SARS-CoV-2/FLU/RSV plus assay is intended as an aid in the diagnosis of influenza from Nasopharyngeal swab specimens and should not be used as a sole basis for treatment. Nasal washings and aspirates are unacceptable for Xpert Xpress SARS-CoV-2/FLU/RSV testing.  Fact Sheet for Patients: EntrepreneurPulse.com.au  Fact Sheet for Healthcare Providers: IncredibleEmployment.be  This test is not yet approved or cleared by the [place] FDA and has been authorized for detection and/or diagnosis of SARS-CoV-2 by FDA under an Emergency Use Authorization (EUA). This EUA will remain in effect (meaning this test can be used) for the duration of the COVID-19 declaration under Section 564(b)(1) of the Act, 21 U.S.C. section 360bbb-3(b)(1), unless the authorization is terminated or revoked.  Performed at [facility], [address],  [place], [place] [address]       Imaging Studies   DG Chest 1 View  Result Date: 06/23/2020 CLINICAL DATA:  Short of breath EXAM: CHEST  1 VIEW COMPARISON:  07/01/2020 FINDINGS: Single frontal view of the chest demonstrates persistent enlargement the cardiac silhouette. Worsening bilateral perihilar airspace disease. Small effusions have enlarged. No pneumothorax. IMPRESSION: 1. Findings consistent with progressive congestive heart failure and pulmonary edema. Electronically Signed   By: Randa Ngo M.D.   On: 06/24/2020 03:59   DG Chest 2 View  Result Date: 07/10/2020 CLINICAL DATA:  Chest pressure, shortness of breath and intermittent nausea. EXAM: CHEST - 2 VIEW COMPARISON:  June 26, 2010 FINDINGS: Moderate to marked severity predominantly bilateral perihilar, bilateral suprahilar and bilateral infrahilar infiltrates are seen. Extension to involve the bilateral lung bases is also noted. There are small bilateral pleural effusions. No pneumothorax is identified. The cardiac silhouette is moderately enlarged. The visualized skeletal structures are unremarkable. IMPRESSION: 1. Moderate to marked severity bilateral infiltrates. 2. Small bilateral pleural effusions. Electronically Signed   By: Virgina Norfolk M.D.   On: 06/27/2020 15:28   US Renal  Result Date: 07/17/2020 CLINICAL DATA:  Acute renal failure. EXAM: RENAL / URINARY TRACT ULTRASOUND COMPLETE COMPARISON:  Ultrasound abdomen 12/29/2012, ultrasound renal 04/25/1999 FINDINGS: Right Kidney: Renal measurements: 7.4 x 3 x 4.1 cm = volume: 85 mL. Echogenicity is increased. No mass or hydronephrosis visualized. Left Kidney: Renal measurements: 8.8 x 4.6 x 4.3 cm = volume: 91 mL. Echogenicity is increased. Underlying mass within the left kidney cannot be excluded. Urinary bladder: Appears normal for degree of bladder distention. Other: None. IMPRESSION: Atrophic and echogenic bilateral kidneys suggesting renal parenchymal disease with an underlying  mass within the left kidney not excluded. Recommend MRI or CT renal protocol for further evaluation. Electronically Signed   By: Iven Finn M.D.   On: 07/07/2020 21:23     Medications   Scheduled Meds: . aspirin EC  81 mg Oral Daily  . furosemide  40 mg Intravenous BID  . heparin  5,000 Units Subcutaneous Q8H  . hydrALAZINE  25 mg Oral BID  . insulin aspart  0-9 Units Subcutaneous TID WC   Continuous Infusions:     LOS: 1 day    Time spent: 35 minutes with greater than 50% spent in coordination of care and at bedside    Ezekiel Slocumb, DO [facility]  06/27/2020, 8:03 AM    If 7PM-7AM, please contact night-coverage. How to contact the [facility] Attending or Consulting provider [place] or covering provider during after hours [place], for this patient?    1. Check the care team in [facility] and look for a) attending/consulting [facility] provider listed and b) the [facility] team listed 2. Log into [email or web address] and use [place]'s universal password to access. If you do not have the password, please contact the hospital operator. 3. Locate the [facility] provider you are looking for under [facility] and page to a number that you can be directly reached. 4. If you still have difficulty reaching the provider, please page the [facility] (Director on Call) for the Hospitalists listed on amion for assistance.

## 2020-06-22 NOTE — Op Note (Signed)
  OPERATIVE NOTE   PROCEDURE: 1. Insertion of temporary dialysis catheter catheter right femoral approach.  PRE-OPERATIVE DIAGNOSIS: Acute on chronic renal insufficiency with volume overload requiring hemodialysis  POST-OPERATIVE DIAGNOSIS: Same  SURGEON: Katha Cabal M.D.  ANESTHESIA: 1% lidocaine local infiltration  ESTIMATED BLOOD LOSS: Minimal cc  INDICATIONS:   Stephanie Casey is a 65 y.o. female who presents to the [facility] regional emergency room with shortness of breath and advanced uremic syndrome.  She has known chronic renal insufficiency and recently underwent a biopsy.  She now presents to the emergency room and laboratory data demonstrates an abrupt deterioration.  She will require urgent hemodialysis and therefore a temporary catheter is being placed given her overall condition.  We are unable to find family and she is unable to consent so consent has been obtained by approval of both myself as well as the hospitalist and Dr. Juleen China confirming that this is an emergent lifesaving procedure  DESCRIPTION: After obtaining full informed written consent, the patient was positioned supine. The right groin was prepped and draped in a sterile fashion. Ultrasound was placed in a sterile sleeve. Ultrasound was utilized to identify the right common femoral vein which is noted to be echolucent and compressible indicating patency. Images recorded for the permanent record. Under real-time visualization a Seldinger needle is inserted into the vein and the guidewires advanced without difficulty. Small counterincision was made at the wire insertion site. Dilator is passed over the wire and the temporary dialysis catheter catheter is fed over the wire without difficulty.  All lumens aspirate and flush easily and are packed with heparin saline. Catheter secured to the skin of the right thigh with 2-0 silk. A sterile dressing is applied with Biopatch.  COMPLICATIONS: None  CONDITION:  Unchanged  Hortencia Pilar Office:  [phone] 07/10/2020, 6:24 PM

## 2020-06-22 NOTE — Progress Notes (Signed)
Dr. Juleen China made aware of continued decrease with blood pressure, new order received to keep map above 65.

## 2020-06-22 NOTE — Sedation Documentation (Signed)
RT bagging pt

## 2020-06-22 NOTE — Consult Note (Signed)
NAME:  Stephanie Casey, MRN:  [id number], DOB:  [birth year], LOS: 1 ADMISSION DATE:  07/11/2020, CONSULTATION DATE:  01/17/20222 REFERRING MD:  Dr. Sidney Ace, CHIEF COMPLAINT:  Acute Hypoxic Respiratory Failure  Brief History:  65 y.o. 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  Failed trial of BiPAP in ED requiring emergent intubation.  History of Present Illness:  Stephanie Casey is a 65 y.o. female with medical history significant for hypertension, type 2 diabetes mellitus, stage IIIb chronic kidney disease, anemia of chronic kidney disease with baseline hemoglobin 10-11, who is admitted to [facility] on 07/12/2020 with acute hypoxic respiratory failure in the setting of new diagnosis of acutely decompensated heart failure after presenting from home to [facility] complaining of shortness of breath.   The patient reports 4 to 5 days of progressive shortness of breath associated with orthopnea and new onset edema involving the bilateral lower extremities.  She also reports new onset nonproductive cough over that time in the absence of any hemoptysis.  Denies any lower extremity edema or calf tenderness.  Has noted some mild wheezing over the last few days, which is also new for her.  Denies any associated recent chest pain or trauma.  Denies any recent palpitations, diaphoresis, presyncope, syncope.  She does however note intermittent nausea resulting in 2-3 episodes of nonbloody, nonbilious emesis over the last few days.  Not associate with any abdominal pain, diarrhea, melena, or hematochezia.  She also denies any recent dysuria, gross hematuria, or urinary urgency/frequency.  Overall, she denies any recent changes in her urinary habits, noting any recent decline in urine output.  No recent rash.  Denies any recent subjective fever, chills, rigors, or generalized  myalgias.  The patient denies any recent traveling, domestically or internationally.  Denies any recent surgical procedures.  No personal or family history of DVT/PE.  She confirms that she is a lifelong non-smoker.  Of note, the patient reports an unintentional weight loss of 20 pounds over the last 1 month.  Patient denies any known history of prior heart failure, and initial chart review yields no prior echocardiogram results.  Denies any use of recreational drugs or regular consumption of alcohol.  Of note, the patient was diagnosed with COVID-19 infection in August 2020, with positive COVID-19 result on 02/09/2019, prompting admission to [facility] at the time.  She denies any known chronic underlying pulmonary pathology, and confirms she confirms no baseline supplemental oxygen requirements.   Medical history is also notable for stage IIIb chronic kidney disease, baseline creatinine of 1.7, most recently in August 2020.  This is also associated with history of anemia of chronic kidney disease, with baseline hemoglobin 10-11.   She was admitted to be admitted by the Hospitalists for further workup and treatment of Acute Hypoxic Respiratory Failure due to Acute Decompensated HFpEF, volume overload, and pulmonary edema, along with AKI superimposed on CKD III.  Hospital Course: On 06/19/2020 she required BiPAP due hypoxia, and was placed on Precedex due to encephalopathy and intermittently taking off the BiPAP.  Vascular Surgery placed a temporary Trialsysis catheter for hemodialysis.  Later in the evening she again became hypoxic despite increased BiPAP settings, requiring intubation in the ED.   PCCM was consulted for assistance in ventilator management.   Past Medical History:   . Diabetes mellitus without complication ([place])   . Hyperlipidemia   . Hypertension  Significant Hospital Events:  1/4: Emergently intubated in ED due to severe Hypoxia; PCCM consulted  Consults:   Hospitalist Nephrology Vascular Surgery PCCM  Procedures:  1/4: Right Femoral Trialysis placed by Vascular Surgery  Significant Diagnostic Tests:  1/3: CXR>>Moderate to marked severity predominantly bilateral perihilar, bilateral suprahilar and bilateral infrahilar infiltrates are seen. Extension to involve the bilateral lung bases is also noted. There are small bilateral pleural effusions. No pneumothorax is identified. The cardiac silhouette is moderately enlarged. The visualized skeletal structures are unremarkable. 1/3: Renal US>>Atrophic and echogenic bilateral kidneys suggesting renal parenchymal disease with an underlying mass within the left kidney not excluded. Recommend MRI or CT renal protocol for further evaluation.  Micro Data:  1/3: SARS-CoV-2 PCR>>negative 1/3: Influenza A&B PCR>>negative 1/3: Blood culture x2>> 1/4: HIV Screen>>nonreactive 1/4: Hepatitis B Surface Ag>>nonreactive 1/4:Hepatitis B S ab>>nonreactive 1/4: Hepatitis B core Ab, IgM>>nonreactive 1/4: Hepatitis C Ab>>nonreactive  Antimicrobials:  N/A  Interim History / Subjective:  Required emergent intubation by ED Provider Sedated with fentanyl and propofol Hemodialysis being initiated  Objective   Blood pressure 129/75, pulse 86, temperature 98.3 F (36.8 C), temperature source Oral, resp. rate 11, height 5' 1"  (1.549 m), weight 74.3 kg, SpO2 90 %.    FiO2 (%):  [70 %-100 %] 100 %  No intake or output data in the 24 hours ending 06/26/2020 2018 Filed Weights   07/06/2020 1441 06/20/2020 0718 06/19/2020 2009  Weight: 68 kg 66.1 kg 74.3 kg    Examination: General: Critically ill appearing female, laying in bed, intubated and sedated, in NAD HENT: Atraumatic, normocephalic, neck supple, + JVD Lungs: Coarse crackles bilaterally, no wheezing, vent assisted, even Cardiovascular: Regular rate and rhythm, s1s2, no M/R/G Abdomen: Soft, nontender, nondistended, no guarding or rebound tenderness, BS+  x4 Extremities: No deformities, normal bulk and tone Neuro: Sedated, withdraws from pain, reaches for ETT, pupils PERRL Skin: warm and dry.  No obvious rashes, lesions, or ulcerations  Resolved Hospital Problem list   N/A  Assessment & Plan:   Acute Hypoxic Respiratory Failure in the setting of Volume Overload & Pulmonary Edema -Mechanical ventilation  -Wean PEEP and FiO2 as able to maintain O2 sats >92% -Follow intermittent CXR & ABG as needed -Goal plateau pressure less than 30, driving pressure less than 15 -Paralytics if necessary for vent synchrony, gas exchange -Cycle prone positioning if necessary for oxygenation -Deep sedation per PAD protocol, goal RASS -4, currently fentanyl, propfol   -VAP prevention order set -Spontaneous breathing trials when respiratory parameters met -Hemodialysis vs. CRRT for volume removal   Acute Decompensated HFpEF Mildly Elevated Troponin, suspect due to demand ischemia Hypotension, suspect in setting of sedation and initiation of Hemodialysis (pt not previously hypotensive until sedation started)>>resolved Hx: HTN -Continuous cardiac monitoring -Maintain MAP >65 -Vasopressors as needed to maintain MAP >65 -Trend Troponin -Diuresis as renal function and BP permits -Dialysis for volume removal -Echo 07/03/2020 w/ LVEF 40 to 92%, diastolic parameters indeterminate, RV systolic function normal, moderately elevated pulmonary artery systolic pressure; severe tricuspid valve regurgiation   AKI superimposed on CKD Stage IIIb -Monitor I&O's / urinary output -Follow BMP -Ensure adequate renal perfusion -Avoid nephrotoxic agents as able -Replace electrolytes as indicated -Nephrology following, appreciate input -HD vs. CRRT as per Neprhology   Anemia without s/sx of bleeding -Monitor for S/Sx of bleeding -Trend CBC -Heparin SQ for VTE Prophylaxis  -Transfuse for Hgb <7   Diabetes Mellitus Type II -CBG's -SSI -Follow ICU Hypo/hyperglycemia  protocol   Acute Metabolic Encephalopathy Sedation  needs in the setting of mechanical ventilation -Propofol and Fentanyl gtt's to maintain RASS -3 to -4 (needs higher RASS due to high vent settings and hypoxia) -Daily wake up assessments -Provide supportive care -Consider Head CT   Best practice (evaluated daily)  Diet: NPO Pain/Anxiety/Delirium protocol (if indicated): Fentanyl and Propofol VAP protocol (if indicated): Yes DVT prophylaxis: SQ Heparin GI prophylaxis: Pepcid Glucose control: SSI Mobility: Bedrest Disposition:ICU  Goals of Care:  Last date of multidisciplinary goals of care discussion:N/A Family and staff present: No family available Summary of discussion: N/A Follow up goals of care discussion due: 06/23/20 Code Status: Full code  Labs   CBC: Recent Labs  Lab 06/24/2020 1454 07/15/2020 0223  WBC 10.1 12.4*  NEUTROABS  --  11.4*  HGB 9.9* 10.1*  HCT 29.8* 30.6*  MCV 93.4 93.9  PLT 373 854    Basic Metabolic Panel: Recent Labs  Lab 07/14/2020 1454 07/01/2020 1843 07/11/2020 0223  NA 133*  --  134*  K 4.9  --  4.9  CL 99  --  97*  CO2 23  --  24  GLUCOSE 146*  --  207*  BUN 78*  --  80*  CREATININE 6.46*  --  6.60*  CALCIUM 8.4*  --  8.6*  MG  --  3.0* 2.9*  PHOS  --   --  6.0*   GFR: Estimated Creatinine Clearance: 8 mL/min (A) (by C-G formula based on SCr of 6.6 mg/dL (H)). Recent Labs  Lab 07/02/2020 0222 06/20/2020 1454 07/06/2020 1843 07/06/2020 0223  PROCALCITON  --  <0.10  --   --   WBC  --  10.1  --  12.4*  LATICACIDVEN 1.5  --  0.9  --     Liver Function Tests: Recent Labs  Lab 07/17/2020 1454 06/23/2020 0223  AST 31 28  ALT 38 37  ALKPHOS 121 131*  BILITOT 0.8 0.9  PROT 6.9 7.3  ALBUMIN 3.3* 3.4*   No results for input(s): LIPASE, AMYLASE in the last 168 hours. No results for input(s): AMMONIA in the last 168 hours.  ABG No results found for: PHART, PCO2ART, PO2ART, HCO3, TCO2, ACIDBASEDEF, O2SAT   Coagulation Profile: No  results for input(s): INR, PROTIME in the last 168 hours.  Cardiac Enzymes: No results for input(s): CKTOTAL, CKMB, CKMBINDEX, TROPONINI in the last 168 hours.  HbA1C: Hgb A1c MFr Bld  Date/Time Value Ref Range Status  07/12/2020 06:43 PM 5.3 4.8 - 5.6 % Final    Comment:    (NOTE) Pre diabetes:          5.7%-6.4%  Diabetes:              >6.4%  Glycemic control for   <7.0% adults with diabetes   02/09/2019 01:47 PM 6.2 (H) 4.8 - 5.6 % Final    Comment:    (NOTE) Pre diabetes:          5.7%-6.4% Diabetes:              >6.4% Glycemic control for   <7.0% adults with diabetes     CBG: Recent Labs  Lab 07/05/2020 1934 07/01/2020 2249 06/19/2020 0720 06/19/2020 1204 07/15/2020 1608  GLUCAP 148* 182* 199* 123* 79    Review of Systems:   Unable to assess due to intubation, sedation, and critical illness  Past Medical History:  She,  has a past medical history of Diabetes mellitus without complication ([place]), Hyperlipidemia, and Hypertension.   Surgical History:   Past Surgical  History:  Procedure Laterality Date  . EYE SURGERY       Social History:   reports that she has never smoked. She has never used smokeless tobacco. She reports that she does not drink alcohol and does not use drugs.   Family History:  Her family history is not on file.   Allergies Allergies  Allergen Reactions  . Hydrochlorothiazide     Pancreatitis     Home Medications  Prior to Admission medications   Medication Sig Start Date End Date Taking? Authorizing Provider  aspirin EC 81 MG tablet Take 81 mg by mouth daily.   Yes [provider]  hydrALAZINE (APRESOLINE) 25 MG tablet Take 25 mg by mouth 2 (two) times daily.   Yes [provider]  insulin NPH Human (NOVOLIN N) 100 UNIT/ML injection Inject 25 Units into the skin 2 (two) times daily.   Yes [provider]  sevelamer carbonate (RENVELA) 800 MG tablet Take 800 mg by mouth 3 (three) times daily with meals.   Yes  [provider]  traZODone (DESYREL) 50 MG tablet Take 50-100 mg by mouth at bedtime.   Yes [provider]  acetaminophen (TYLENOL) 325 MG tablet Take 2 tablets by mouth every 6 (six) hours as needed. 04/13/20   [provider]  atenolol (TENORMIN) 100 MG tablet Take 100 mg by mouth daily. Patient not taking: No sig reported    [provider]     Critical care time: 60 minutes    Darel Hong, [facility] Pager: [phone]

## 2020-06-22 NOTE — Sedation Documentation (Signed)
ETT tube in place with color change 21 at the teeth

## 2020-06-22 NOTE — ED Notes (Signed)
Pt removing bipap, refusing to allow staff to place back on initially. Pt oxygen saturation decreased down into 70's. Interpretor on ipad used to explain situation. Bipap placed back on pt. MD notified. Additional medications ordered and adminisered to calm pt and allow her to tolerate bipap.

## 2020-06-22 NOTE — ED Notes (Signed)
Pt attempted to get up OOB to use the restroom.  Pt told she has urinary catheter in at this time.  Pt placed back in bed by this RN and put back on her BiPAP mask, which had come off.

## 2020-06-22 NOTE — ED Notes (Signed)
RT at bedside.  Pt placed on BiPAP at this time.  Pt tolerating well.  Will continue to monitor.

## 2020-06-22 NOTE — ED Notes (Signed)
Pt still agitated moving about in bed. PRN orders reviewed and will administer.

## 2020-06-22 NOTE — Progress Notes (Signed)
Critical care note: Date of note: 07/19/2020      [place]   PATIENT NAME: Stephanie Casey    MR#:  [id number]  DATE OF BIRTH:  16-May-1956  DATE OF ADMISSION:  06/24/2020  PRIMARY CARE PHYSICIAN: Freddy Finner, NP     CHIEF COMPLAINT:   Chief Complaint  Patient presents with  . Chest Pain  . Shortness of Breath    HISTORY OF PRESENT ILLNESS:  Stephanie Casey  is a 65 y.o. Hispanic female with a known history of significant forhypertension, type 2 diabetes mellitus, stage IIIb chronic kidney disease, anemia of chronic disease who presented to the ED on 07/01/2020 with 4 to 5 days of progressively worsening shortness of breath and associated b/l lower extremity edema, new productive cough, mild intermittent nausea with 2-3 episodes of emesis.  Evaluation in the ED revealed severe AKI with creatinine 6.46 (prior baseline Cr 1.7) with chest xray showing significant pulmonary edema.  She was hemodynamically stable with tachypnea and hypoxia initially requiring 3 L/min oxygen.  She was admitted for further management of acute respiratory failure with hypoxia due to ? Acutely decompensated CHF and acute kidney injury.  The patient was placed on BiPAP and had a hard time keeping it.  She was given IV Ativan and IM Haldol earlier this morning and was calmer.  During the day apparently she had to be placed on Precedex.  The patient was seen by nephrology and decision was made for urgent dialysis.  Temporary hemodialysis catheter was placed this afternoon and hemodialysis was ordered.  Unfortunately patient desatted on BiPAP right prior to dialysis.  Decision was made to intubate the patient especially as his chest x-ray showed florid worsening acute pulmonary edema.  PAST MEDICAL HISTORY:   Past Medical History:  Diagnosis Date  . Diabetes mellitus without complication ([place])   . Hyperlipidemia   . Hypertension     PAST SURGICAL HISTORY:   Past Surgical History:   Procedure Laterality Date  . EYE SURGERY      SOCIAL HISTORY:   Social History   Tobacco Use  . Smoking status: Never Smoker  . Smokeless tobacco: Never Used  Substance Use Topics  . Alcohol use: Never    FAMILY HISTORY:  History reviewed. No pertinent family history.  DRUG ALLERGIES:   Allergies  Allergen Reactions  . Hydrochlorothiazide     Pancreatitis    REVIEW OF SYSTEMS:   ROS As per history of present illness. All pertinent systems were reviewed above. Constitutional, HEENT, cardiovascular, respiratory, GI, GU, musculoskeletal, neuro, psychiatric, endocrine, integumentary and hematologic systems were reviewed and are otherwise negative/unremarkable except for positive findings mentioned above in the HPI.   MEDICATIONS AT HOME:   Prior to Admission medications   Medication Sig Start Date End Date Taking? Authorizing Provider  aspirin EC 81 MG tablet Take 81 mg by mouth daily.   Yes [provider]  hydrALAZINE (APRESOLINE) 25 MG tablet Take 25 mg by mouth 2 (two) times daily.   Yes [provider]  insulin NPH Human (NOVOLIN N) 100 UNIT/ML injection Inject 25 Units into the skin 2 (two) times daily.   Yes [provider]  sevelamer carbonate (RENVELA) 800 MG tablet Take 800 mg by mouth 3 (three) times daily with meals.   Yes [provider]  traZODone (DESYREL) 50 MG tablet Take 50-100 mg by mouth at bedtime.   Yes [provider]  acetaminophen (TYLENOL) 325 MG tablet Take 2 tablets by mouth  every 6 (six) hours as needed. 04/13/20   [provider]  atenolol (TENORMIN) 100 MG tablet Take 100 mg by mouth daily. Patient not taking: No sig reported    [provider]      VITAL SIGNS:  Blood pressure 129/75, pulse 86, temperature 98.3 F (36.8 C), temperature source Oral, resp. rate 11, height 5\' 1"  (1.549 m), weight 74.3 kg, SpO2 90 %.  PHYSICAL EXAMINATION:  Physical Exam  GENERAL: Acutely ill  65 y.o.-year-old Hispanic female patient lying in the bed in severe respiratory distress on BiPAP.  She was very lethargic. EYES: Pupils equal, round, reactive to light and accommodation. No scleral icterus. Extraocular muscles intact.  HEENT: Head atraumatic, normocephalic. Oropharynx and nasopharynx clear.  NECK:  Supple, no jugular venous distention. No thyroid enlargement, no tenderness.  LUNGS: Bibasal and midlung zone crackles with diffusely coarse breath sounds.   CARDIOVASCULAR: Regular rate and rhythm, S1, S2 normal. No murmurs, rubs, or gallops.  ABDOMEN: Soft, nondistended, nontender. Bowel sounds present. No organomegaly or mass.  EXTREMITIES: No pedal edema, cyanosis, or clubbing.  NEUROLOGIC: Cranial nerves II through XII are intact. Muscle strength 5/5 in all extremities. Sensation intact. Gait not checked.  PSYCHIATRIC: The patient is alert and oriented x 3.  Normal affect and good eye contact. SKIN: No obvious rash, lesion, or ulcer.   LABORATORY PANEL:   CBC Recent Labs  Lab 07/02/2020 0223  WBC 12.4*  HGB 10.1*  HCT 30.6*  PLT 399   ------------------------------------------------------------------------------------------------------------------  Chemistries  Recent Labs  Lab 07/03/2020 0223  NA 134*  K 4.9  CL 97*  CO2 24  GLUCOSE 207*  BUN 80*  CREATININE 6.60*  CALCIUM 8.6*  MG 2.9*  AST 28  ALT 37  ALKPHOS 131*  BILITOT 0.9   ------------------------------------------------------------------------------------------------------------------  Cardiac Enzymes No results for input(s): TROPONINI in the last 168 hours. ------------------------------------------------------------------------------------------------------------------  RADIOLOGY:  DG Chest 1 View  Result Date: 07/08/2020 CLINICAL DATA:  Short of breath EXAM: CHEST  1 VIEW COMPARISON:  07/19/2020 FINDINGS: Single frontal view of the chest demonstrates persistent enlargement the cardiac  silhouette. Worsening bilateral perihilar airspace disease. Small effusions have enlarged. No pneumothorax. IMPRESSION: 1. Findings consistent with progressive congestive heart failure and pulmonary edema. Electronically Signed   By: Randa Ngo M.D.   On: 07/17/2020 03:59   DG Chest 2 View  Result Date: 07/19/2020 CLINICAL DATA:  Chest pressure, shortness of breath and intermittent nausea. EXAM: CHEST - 2 VIEW COMPARISON:  June 26, 2010 FINDINGS: Moderate to marked severity predominantly bilateral perihilar, bilateral suprahilar and bilateral infrahilar infiltrates are seen. Extension to involve the bilateral lung bases is also noted. There are small bilateral pleural effusions. No pneumothorax is identified. The cardiac silhouette is moderately enlarged. The visualized skeletal structures are unremarkable. IMPRESSION: 1. Moderate to marked severity bilateral infiltrates. 2. Small bilateral pleural effusions. Electronically Signed   By: Virgina Norfolk M.D.   On: 07/02/2020 15:28   US Renal  Result Date: 06/20/2020 CLINICAL DATA:  Acute renal failure. EXAM: RENAL / URINARY TRACT ULTRASOUND COMPLETE COMPARISON:  Ultrasound abdomen 12/29/2012, ultrasound renal 04/25/1999 FINDINGS: Right Kidney: Renal measurements: 7.4 x 3 x 4.1 cm = volume: 85 mL. Echogenicity is increased. No mass or hydronephrosis visualized. Left Kidney: Renal measurements: 8.8 x 4.6 x 4.3 cm = volume: 91 mL. Echogenicity is increased. Underlying mass within the left kidney cannot be excluded. Urinary bladder: Appears normal for degree of bladder distention. Other: None. IMPRESSION: Atrophic and echogenic bilateral kidneys  suggesting renal parenchymal disease with an underlying mass within the left kidney not excluded. Recommend MRI or CT renal protocol for further evaluation. Electronically Signed   By: Iven Finn M.D.   On: 07/07/2020 21:23   DG Chest Portable 1 View  Result Date: 07/02/2020 CLINICAL DATA:  65 year old  female with decreased O2 saturation. EXAM: PORTABLE CHEST 1 VIEW COMPARISON:  Chest radiograph dated 07/01/2020. FINDINGS: Cardiomegaly with vascular congestion and edema. Pneumonia is not excluded clinical correlation is recommended. Overall progression of pulmonary opacity on the right side. Probable small right pleural effusion. No pneumothorax. No acute osseous pathology. IMPRESSION: Cardiomegaly with findings of CHF. Pneumonia is not excluded. Electronically Signed   By: Anner Crete M.D.   On: 06/23/2020 20:06   ECHOCARDIOGRAM COMPLETE  Result Date: 07/17/2020    ECHOCARDIOGRAM REPORT   Patient Name:   Stephanie Casey Date of Exam: 07/01/2020 Medical Rec #:  [id number]             Height:       61.0 in Accession #:    [id number]            Weight:       145.8 lb Date of Birth:  1955/12/25            BSA:          1.651 m Patient Age:    65 years              BP:           113/96 mmHg Patient Gender: F                     HR:           99 bpm. Exam Location:  [facility] Procedure: 2D Echo, Color Doppler and Cardiac Doppler Indications:     I50.9 Congestive Heart Failure  History:         Patient has no prior history of Echocardiogram examinations.                  Risk Factors:Hypertension, Diabetes and Dyslipidemia.  Sonographer:     Charmayne Sheer RDCS (AE) Referring Phys:  [id number] Rhetta Mura Diagnosing Phys: Nelva Bush MD  Sonographer Comments: Image acquisition challenging due to patient behavioral factors., Image acquisition challenging due to uncooperative patient and Image acquisition challenging due to respiratory motion. IMPRESSIONS  1. Left ventricular ejection fraction, by estimation, is 40 to 45%. The left ventricle has mildly decreased function. The left ventricle demonstrates global hypokinesis. The left ventricular internal cavity size was mildly dilated. There is mild left ventricular hypertrophy. Left ventricular diastolic parameters are indeterminate.  2. Right ventricular  systolic function is normal. The right ventricular size is mildly enlarged. Mildly increased right ventricular wall thickness. There is moderately elevated pulmonary artery systolic pressure.  3. Left atrial size was mild to moderately dilated.  4. Right atrial size was moderately dilated.  5. A small pericardial effusion is present. The pericardial effusion is circumferential.  6. The mitral valve is abnormal. Mild to moderate mitral valve regurgitation. No evidence of mitral stenosis.  7. The tricuspid valve is degenerative. Tricuspid valve regurgitation is severe.  8. The aortic valve has an indeterminant number of cusps. There is mild thickening of the aortic valve. Aortic valve regurgitation is not visualized. No aortic stenosis is present.  9. The inferior vena cava is dilated in size with <50% respiratory variability, suggesting right atrial pressure of  15 mmHg. FINDINGS  Left Ventricle: Left ventricular ejection fraction, by estimation, is 40 to 45%. The left ventricle has mildly decreased function. The left ventricle demonstrates global hypokinesis. The left ventricular internal cavity size was mildly dilated. There is  mild left ventricular hypertrophy. Left ventricular diastolic parameters are indeterminate. Right Ventricle: The right ventricular size is mildly enlarged. Mildly increased right ventricular wall thickness. Right ventricular systolic function is normal. There is moderately elevated pulmonary artery systolic pressure. The tricuspid regurgitant velocity is 3.09 m/s, and with an assumed right atrial pressure of 15 mmHg, the estimated right ventricular systolic pressure is 30.1 mmHg. Left Atrium: Left atrial size was mild to moderately dilated. Right Atrium: Right atrial size was moderately dilated. Pericardium: A small pericardial effusion is present. The pericardial effusion is circumferential. Mitral Valve: The mitral valve is abnormal. There is mild thickening of the mitral valve leaflet(s).  Mild to moderate mitral valve regurgitation. No evidence of mitral valve stenosis. MV peak gradient, 6.2 mmHg. The mean mitral valve gradient is 4.0 mmHg. Tricuspid Valve: The tricuspid valve is degenerative in appearance. Tricuspid valve regurgitation is severe. Aortic Valve: The aortic valve has an indeterminant number of cusps. There is mild thickening of the aortic valve. Aortic valve regurgitation is not visualized. No aortic stenosis is present. Aortic valve mean gradient measures 6.0 mmHg. Aortic valve peak gradient measures 10.9 mmHg. Aortic valve area, by VTI measures 2.36 cm. Pulmonic Valve: The pulmonic valve was grossly normal. Pulmonic valve regurgitation is trivial. No evidence of pulmonic stenosis. Aorta: The aortic root is normal in size and structure. Pulmonary Artery: The pulmonary artery is of normal size. Venous: The inferior vena cava is dilated in size with less than 50% respiratory variability, suggesting right atrial pressure of 15 mmHg. IAS/Shunts: The interatrial septum was not well visualized. Additional Comments: There is pleural effusion in the left lateral region.  LEFT VENTRICLE PLAX 2D LVIDd:         5.40 cm  Diastology LVIDs:         3.80 cm  LV e' lateral:   8.81 cm/s LV PW:         1.20 cm  LV E/e' lateral: 15.0 LV IVS:        1.18 cm LVOT diam:     2.20 cm LV SV:         71 LV SV Index:   43 LVOT Area:     3.80 cm  RIGHT VENTRICLE RV Basal diam:  4.10 cm LEFT ATRIUM           Index       RIGHT ATRIUM           Index LA diam:      4.80 cm 2.91 cm/m  RA Area:     25.90 cm LA Vol (A4C): 89.7 ml 54.32 ml/m RA Volume:   90.90 ml  55.05 ml/m  AORTIC VALVE                    PULMONIC VALVE AV Area (Vmax):    2.26 cm     PV Vmax:       1.26 m/s AV Area (Vmean):   2.11 cm     PV Vmean:      79.200 cm/s AV Area (VTI):     2.36 cm     PV VTI:        0.202 m AV Vmax:           165.00  cm/s  PV Peak grad:  6.4 mmHg AV Vmean:          114.000 cm/s PV Mean grad:  3.0 mmHg AV VTI:             0.300 m AV Peak Grad:      10.9 mmHg AV Mean Grad:      6.0 mmHg LVOT Vmax:         97.90 cm/s LVOT Vmean:        63.200 cm/s LVOT VTI:          0.186 m LVOT/AV VTI ratio: 0.62  AORTA Ao Root diam: 3.20 cm MITRAL VALVE                TRICUSPID VALVE MV Area (PHT): 6.65 cm     TR Peak grad:   38.2 mmHg MV Peak grad:  6.2 mmHg     TR Vmax:        309.00 cm/s MV Mean grad:  4.0 mmHg MV Vmax:       1.25 m/s     SHUNTS MV Vmean:      91.3 cm/s    Systemic VTI:  0.19 m MV Decel Time: 114 msec     Systemic Diam: 2.20 cm MV E velocity: 132.00 cm/s MV A velocity: 106.00 cm/s MV E/A ratio:  1.25 Nelva Bush MD Electronically signed by Nelva Bush MD Signature Date/Time: 07/05/2020/1:02:18 PM    Final       IMPRESSION AND PLAN:   1.  Acute hypoxic respiratory failure, failing BiPAP, likely secondary to acute pulmonary edema due to acute kidney injury on stage IIIb chronic kidney disease requiring hemodialysis due to fluid overload. -The patient was briefly bagged and was given sedation with IV succinylcholine and ketamine. -She was intubated via glidoscope by Dr. Cinda Quest with good bilateral breath sounds. -Ventilator protocol will be followed. -She'll be started immediately on hemodialysis. -Intensivist consult was placed. -I discussed the case with the ICU team.  We'll continue other plan of care.    All the records are reviewed and case discussed with ED provider. The plan of care was discussed in details with the patient (and family). I answered all questions. The patient agreed to proceed with the above mentioned plan. Further management will depend upon hospital course.   CODE STATUS: Full code.  Authorized and performed by: Eugenie Norrie, MD Total critical care time: Approximately  35 minutes. Due to a high probability of clinically significant, life-threatening deterioration, the patient required my highest level of preparedness to intervene emergently and I personally spent this  critical care time directly and personally managing the patient.  This critical care time included obtaining a history, examining the patient, pulse oximetry, ordering and review of studies, arranging urgent treatment with development of management plan, evaluation of patient's response to treatment, frequent reassessment, and discussions with other providers. This critical care time was performed to assess and manage the high probability of imminent, life-threatening deterioration that could result in multiorgan failure.  It was exclusive of separately billable procedures and treating other patients and teaching time.  Please see MDM section and the rest of the note for further information on patient assessment and treatment.       TOTAL CRITICAL CARE TIME TAKING CARE OF THIS PATIENT: 35 minutes.    Christel Mormon M.D on 06/23/2020 at 8:17 PM  [facility]   From 7 PM-7 AM, contact night-coverage [email or web address]  CC: Primary care physician; Freddy Finner, NP

## 2020-06-22 NOTE — Progress Notes (Signed)
*  PRELIMINARY RESULTS* Echocardiogram 2D Echocardiogram has been performed.  Stephanie Casey Tayon Parekh 07/10/2020, 11:55 AM

## 2020-06-22 NOTE — ED Provider Notes (Addendum)
Called to see patient for decreasing oxygen saturations.  Patient's settings on BiPAP were adjusted to raise her PEEP in her rate.  This improved her sats from 82% to 87% but they started to drop again.  Chest x-ray revealed severe CHF bilaterally.  Discussed with hospitalist and preparations were made to intubate the patient.  Patient got ketamine and succinylcholine emergently.  The cords were visualized with the glide scope and the 7 ET tube was passed through the cords under direct vision.  Bilateral breath sounds were obtained with none in the stomach and good color change on the colorimeter monitor.  Chest x-ray will be obtained patient handed back to the hospitalist.  Dialysis is starting immediately.   Nena Polio, MD 07/13/2020 2007 Patient's O2 sats did not drop below 86 from new her baseline of 86-87.   Nena Polio, MD 07/09/2020 2008

## 2020-06-22 NOTE — ED Notes (Signed)
Dr. Sidney Ace made aware of pts increased agitation with the BiPAP.  Will continue to monitor.

## 2020-06-22 NOTE — ED Notes (Signed)
Dewaine Conger, NP and RT aware of O2 sat. See [facility]

## 2020-06-22 NOTE — Progress Notes (Signed)
Patient treatment started at this time. Noted decrease in blood pressure immediately. Oxygen saturation continues to be decreased at this time, respiratory therapist at bedside. UF goal reduced to 500 ml and treatment time reduced to 1.5 hrs per Dr. Juleen China orders. Patient resting comfortably. Primary ED RN, Respiratory therapist at bedside to intervene oxygen saturations and decreased blood pressure. Will continue to monitor.

## 2020-06-22 NOTE — Progress Notes (Signed)
Vasopressors started per ED RN with noted improvement with blood pressures, will continue to monitor.

## 2020-06-22 NOTE — ED Notes (Signed)
Care assumed at this time, pt flailing around in bed. Interpreter on ipad, states pt is stating "Its hot". BiPAP mask is off, pt in mittens. Pts sats 76% on RA. Pt placed on HiFlo at this time at 15L. Is tolerating that. Pt taken out of mittens at this time, interpreter told pt to not pull on any lines or get out of bed. Pt verbalized understanding. Pt moved up in bed into a high fowlers position. Pt fanned at this time. Pt appears more comfortable and states that she is.

## 2020-06-22 NOTE — ED Notes (Signed)
RT increased bipap settings. Pt 93% good pleth

## 2020-06-22 NOTE — ED Notes (Signed)
Previous RN had removed IV in rt ac due to infiltration. Previous RN placed a 20g in rt ac prior to Probation officer taking report. Writer attempted to admin IV ativan due to pr restlessness and IV in left ac had been partially pulled out by pt and no longer able to flush line.

## 2020-06-22 NOTE — ED Notes (Signed)
RT and Nuc Med at bedside. Informed RT that pt would not tolerate a NRB mask. They placed on pt regardless, pt immediately ripped off. Then state "Pt is not going to tolerate procedure". RT walked from room. Pt placed back on Hi flow [place] and pt is resting not pulling at equipment.

## 2020-06-22 NOTE — ED Notes (Signed)
RT paged d/t pt O2 79-87%

## 2020-06-23 ENCOUNTER — Inpatient Hospital Stay: Payer: Medicaid Other

## 2020-06-23 LAB — KAPPA/LAMBDA LIGHT CHAINS
Kappa free light chain: 154.5 mg/L — ABNORMAL HIGH (ref 3.3–19.4)
Kappa, lambda light chain ratio: 1.22 (ref 0.26–1.65)
Lambda free light chains: 126.9 mg/L — ABNORMAL HIGH (ref 5.7–26.3)

## 2020-06-23 LAB — HEMOGLOBIN A1C
Hgb A1c MFr Bld: 5.4 % (ref 4.8–5.6)
Mean Plasma Glucose: 108.28 mg/dL

## 2020-06-23 LAB — PROTEIN ELECTROPHORESIS, SERUM
A/G Ratio: 1.1 (ref 0.7–1.7)
Albumin ELP: 3.4 g/dL (ref 2.9–4.4)
Alpha-1-Globulin: 0.4 g/dL (ref 0.0–0.4)
Alpha-2-Globulin: 0.9 g/dL (ref 0.4–1.0)
Beta Globulin: 0.9 g/dL (ref 0.7–1.3)
Gamma Globulin: 1 g/dL (ref 0.4–1.8)
Globulin, Total: 3.1 g/dL (ref 2.2–3.9)
Total Protein ELP: 6.5 g/dL (ref 6.0–8.5)

## 2020-06-23 LAB — CBC
HCT: 27.4 % — ABNORMAL LOW (ref 36.0–46.0)
Hemoglobin: 8.6 g/dL — ABNORMAL LOW (ref 12.0–15.0)
MCH: 30.7 pg (ref 26.0–34.0)
MCHC: 31.4 g/dL (ref 30.0–36.0)
MCV: 97.9 fL (ref 80.0–100.0)
Platelets: 278 10*3/uL (ref 150–400)
RBC: 2.8 MIL/uL — ABNORMAL LOW (ref 3.87–5.11)
RDW: 13.9 % (ref 11.5–15.5)
WBC: 14.1 10*3/uL — ABNORMAL HIGH (ref 4.0–10.5)
nRBC: 0 % (ref 0.0–0.2)

## 2020-06-23 LAB — BASIC METABOLIC PANEL
Anion gap: 16 — ABNORMAL HIGH (ref 5–15)
BUN: 63 mg/dL — ABNORMAL HIGH (ref 8–23)
CO2: 20 mmol/L — ABNORMAL LOW (ref 22–32)
Calcium: 8.3 mg/dL — ABNORMAL LOW (ref 8.9–10.3)
Chloride: 102 mmol/L (ref 98–111)
Creatinine, Ser: 5.44 mg/dL — ABNORMAL HIGH (ref 0.44–1.00)
GFR, Estimated: 8 mL/min — ABNORMAL LOW (ref >60)
Glucose, Bld: 96 mg/dL (ref 70–99)
Potassium: 4.4 mmol/L (ref 3.5–5.1)
Sodium: 138 mmol/L (ref 135–145)

## 2020-06-23 LAB — GLUCOSE, CAPILLARY
Glucose-Capillary: 81 mg/dL (ref 70–99)
Glucose-Capillary: 101 mg/dL — ABNORMAL HIGH (ref 70–99)
Glucose-Capillary: 129 mg/dL — ABNORMAL HIGH (ref 70–99)
Glucose-Capillary: 101 mg/dL — ABNORMAL HIGH (ref 70–99)
Glucose-Capillary: 96 mg/dL (ref 70–99)
Glucose-Capillary: 142 mg/dL — ABNORMAL HIGH (ref 70–99)

## 2020-06-23 LAB — PROCALCITONIN: Procalcitonin: 26.01 ng/mL

## 2020-06-23 LAB — MRSA PCR SCREENING: MRSA by PCR: NEGATIVE

## 2020-06-23 LAB — TRIGLYCERIDES: Triglycerides: 91 mg/dL (ref <150)

## 2020-06-23 LAB — PROTEIN / CREATININE RATIO, URINE
Creatinine, Urine: 45 mg/dL
Protein Creatinine Ratio: 3.78 mg/mg{Cre} — ABNORMAL HIGH (ref 0.00–0.15)
Total Protein, Urine: 170 mg/dL

## 2020-06-23 LAB — TROPONIN I (HIGH SENSITIVITY): Troponin I (High Sensitivity): 75 ng/L — ABNORMAL HIGH (ref <18)

## 2020-06-23 LAB — BRAIN NATRIURETIC PEPTIDE: B Natriuretic Peptide: 4058.4 pg/mL — ABNORMAL HIGH (ref 0.0–100.0)

## 2020-06-23 MED ORDER — FUROSEMIDE 10 MG/ML IJ SOLN
40.0000 mg | Freq: Once | INTRAMUSCULAR | Status: AC
  Administered 2020-06-23: 40 mg via INTRAVENOUS

## 2020-06-23 MED ORDER — PROSOURCE TF PO LIQD
45.0000 mL | Freq: Every day | ORAL | Status: DC
  Administered 2020-06-24 – 2020-06-29 (×6): 45 mL
  Filled 2020-06-23 (×7): qty 45

## 2020-06-23 MED ORDER — VECURONIUM BROMIDE 10 MG IV SOLR
INTRAVENOUS | Status: AC
  Administered 2020-06-23: 10 mg via INTRAVENOUS
  Filled 2020-06-23: qty 10

## 2020-06-23 MED ORDER — ACETAMINOPHEN 650 MG RE SUPP: 650.0000 mg | Freq: Four times a day (QID) | RECTAL | Status: DC | PRN

## 2020-06-23 MED ORDER — ORAL CARE MOUTH RINSE
15.0000 mL | OROMUCOSAL | Status: DC
  Administered 2020-06-23 – 2020-06-30 (×71): 15 mL via OROMUCOSAL

## 2020-06-23 MED ORDER — RENA-VITE PO TABS: 1.0000 | ORAL_TABLET | Freq: Every day | ORAL | Status: DC

## 2020-06-23 MED ORDER — VECURONIUM BROMIDE 10 MG IV SOLR
10.0000 mg | INTRAVENOUS | Status: DC | PRN
  Administered 2020-06-23: 10 mg via INTRAVENOUS
  Filled 2020-06-23 (×2): qty 10

## 2020-06-23 MED ORDER — HYDRALAZINE HCL 25 MG PO TABS
25.0000 mg | ORAL_TABLET | Freq: Two times a day (BID) | ORAL | Status: DC
  Administered 2020-06-23 – 2020-06-30 (×12): 25 mg
  Filled 2020-06-23 (×14): qty 1

## 2020-06-23 MED ORDER — VITAL AF 1.2 CAL PO LIQD
1000.0000 mL | ORAL | Status: DC
  Administered 2020-06-23 – 2020-06-29 (×3): 1000 mL

## 2020-06-23 MED ORDER — ASPIRIN 81 MG PO CHEW
81.0000 mg | CHEWABLE_TABLET | Freq: Every day | ORAL | Status: DC
  Administered 2020-06-23 – 2020-06-30 (×8): 81 mg
  Filled 2020-06-23 (×8): qty 1

## 2020-06-23 MED ORDER — ADULT MULTIVITAMIN LIQUID CH
15.0000 mL | Freq: Every day | ORAL | Status: DC
  Administered 2020-06-24 – 2020-06-30 (×7): 15 mL
  Filled 2020-06-23 (×8): qty 15

## 2020-06-23 MED ORDER — CHLORHEXIDINE GLUCONATE 0.12% ORAL RINSE (MEDLINE KIT)
15.0000 mL | Freq: Two times a day (BID) | OROMUCOSAL | Status: DC
  Administered 2020-06-23 – 2020-06-30 (×15): 15 mL via OROMUCOSAL

## 2020-06-23 MED ORDER — ACETAMINOPHEN 325 MG PO TABS
650.0000 mg | ORAL_TABLET | Freq: Four times a day (QID) | ORAL | Status: DC | PRN
  Administered 2020-06-26 – 2020-06-29 (×3): 650 mg
  Filled 2020-06-23 (×3): qty 2

## 2020-06-23 MED ORDER — LORAZEPAM 2 MG/ML IJ SOLN
2.0000 mg | INTRAMUSCULAR | Status: DC | PRN
  Administered 2020-06-23 – 2020-06-30 (×3): 2 mg via INTRAVENOUS
  Filled 2020-06-23 (×2): qty 1

## 2020-06-23 MED ORDER — CHLORHEXIDINE GLUCONATE CLOTH 2 % EX PADS
6.0000 | MEDICATED_PAD | Freq: Every day | CUTANEOUS | Status: DC
  Administered 2020-06-23 – 2020-06-30 (×8): 6 via TOPICAL
  Filled 2020-06-23: qty 6

## 2020-06-23 NOTE — Progress Notes (Signed)
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 Patient was intubated in the ED and is being prepared for emergent dialysis.  eICU Interventions  New Patient Evaluation completed.        Kerry Kass Layna Roeper 06/23/2020, 3:26 AM

## 2020-06-23 NOTE — ED Notes (Signed)
Pt monitor alarming, upon entering room patient alert, pulling at tube (with mitts in place).  Propofol to be titrated up for desired sedation.

## 2020-06-23 NOTE — Progress Notes (Signed)
[facility]  ROUNDING NOTE   Subjective:   Patient intubated yesterday.  Patient underwent initial hemodialysis treatment yesterday. UF of 0.371 liter. Ultrafiltration limited by hypotension.   Patient transferred to ICU and placed on norepinephrine gtt.  IV furosemide: UOP 675  Objective:  Vital signs in last 24 hours:  Temp:  [98.78 F (37.1 C)-99.5 F (37.5 C)] 98.78 F (37.1 C) (01/05 1200) Pulse Rate:  [75-105] 86 (01/05 1200) Resp:  [0-41] 16 (01/05 1200) BP: (54-171)/(44-111) 136/70 (01/05 1200) SpO2:  [79 %-100 %] 95 % (01/05 1200) FiO2 (%):  [40 %-100 %] 40 % (01/05 1317) Weight:  [74.3 kg] 74.3 kg (01/04 2009)  Weight change: -1.905 kg Filed Weights   06/19/2020 1441 07/01/2020 0718 06/27/2020 2009  Weight: 68 kg 66.1 kg 74.3 kg    Intake/Output: I/O last 3 completed shifts: In: 208.3 [I.V.:157.1; IV Piggyback:51.1] Out: 1046 [Urine:675; Other:371]   Intake/Output this shift:  Total I/O In: 304.8 [I.V.:184.8; NG/GT:120] Out: 300 [Urine:300]  Physical Exam: General: Critically ill  Head: ETT  Eyes: Anicteric, PERRL  Neck: Supple, trachea midline  Lungs:  Bilateral crackles, PRVC 50%  Heart: Regular rate and rhythm  Abdomen:  Soft, obese  Extremities: + peripheral edema.  Neurologic: Intubated and sedated  Skin: No lesions  Access: Right femoral temp HD catheter 1/4 Dr. Delana Meyer    Basic Metabolic Panel: Recent Labs  Lab 07/15/2020 1454 06/27/2020 1843 07/14/2020 0223 06/23/20 0559  NA 133*  --  134* 138  K 4.9  --  4.9 4.4  CL 99  --  97* 102  CO2 23  --  24 20*  GLUCOSE 146*  --  207* 96  BUN 78*  --  80* 63*  CREATININE 6.46*  --  6.60* 5.44*  CALCIUM 8.4*  --  8.6* 8.3*  MG  --  3.0* 2.9*  --   PHOS  --   --  6.0*  --     Liver Function Tests: Recent Labs  Lab 06/20/2020 1454 07/04/2020 0223  AST 31 28  ALT 38 37  ALKPHOS 121 131*  BILITOT 0.8 0.9  PROT 6.9 7.3  ALBUMIN 3.3* 3.4*   No results for input(s): LIPASE, AMYLASE  in the last 168 hours. No results for input(s): AMMONIA in the last 168 hours.  CBC: Recent Labs  Lab 07/14/2020 1454 06/29/2020 0223 07/17/2020 2130 06/23/20 0559  WBC 10.1 12.4* 5.7 14.1*  NEUTROABS  --  11.4*  --   --   HGB 9.9* 10.1* 8.2* 8.6*  HCT 29.8* 30.6* 25.0* 27.4*  MCV 93.4 93.9 94.3 97.9  PLT 373 399 269 278    Cardiac Enzymes: No results for input(s): CKTOTAL, CKMB, CKMBINDEX, TROPONINI in the last 168 hours.  BNP: Invalid input(s): POCBNP  CBG: Recent Labs  Lab 06/29/2020 2239 07/12/2020 2353 06/23/20 0242 06/23/20 0737 06/23/20 1227  GLUCAP 94 75 81 101* 129*    Microbiology: Results for orders placed or performed during the hospital encounter of 07/07/2020  Resp Panel by RT-PCR (Flu A&B, Covid) Nasopharyngeal Swab     Status: None   Collection Time: 07/08/2020  6:43 PM   Specimen: Nasopharyngeal Swab; Nasopharyngeal(NP) swabs in vial transport medium  Result Value Ref Range Status   SARS Coronavirus 2 by RT PCR NEGATIVE NEGATIVE Final    Comment: (NOTE) SARS-CoV-2 target nucleic acids are NOT DETECTED.  The SARS-CoV-2 RNA is generally detectable in upper respiratory specimens during the acute phase of infection. The lowest concentration of  SARS-CoV-2 viral copies this assay can detect is 138 copies/mL. A negative result does not preclude SARS-Cov-2 infection and should not be used as the sole basis for treatment or other patient management decisions. A negative result may occur with  improper specimen collection/handling, submission of specimen other than nasopharyngeal swab, presence of viral mutation(s) within the areas targeted by this assay, and inadequate number of viral copies(<138 copies/mL). A negative result must be combined with clinical observations, patient history, and epidemiological information. The expected result is Negative.  Fact Sheet for Patients:  EntrepreneurPulse.com.au  Fact Sheet for Healthcare Providers:   IncredibleEmployment.be  This test is no t yet approved or cleared by the [place] FDA and  has been authorized for detection and/or diagnosis of SARS-CoV-2 by FDA under an Emergency Use Authorization (EUA). This EUA will remain  in effect (meaning this test can be used) for the duration of the COVID-19 declaration under Section 564(b)(1) of the Act, 21 U.S.C.section 360bbb-3(b)(1), unless the authorization is terminated  or revoked sooner.       Influenza A by PCR NEGATIVE NEGATIVE Final   Influenza B by PCR NEGATIVE NEGATIVE Final    Comment: (NOTE) The Xpert Xpress SARS-CoV-2/FLU/RSV plus assay is intended as an aid in the diagnosis of influenza from Nasopharyngeal swab specimens and should not be used as a sole basis for treatment. Nasal washings and aspirates are unacceptable for Xpert Xpress SARS-CoV-2/FLU/RSV testing.  Fact Sheet for Patients: EntrepreneurPulse.com.au  Fact Sheet for Healthcare Providers: IncredibleEmployment.be  This test is not yet approved or cleared by the [place] FDA and has been authorized for detection and/or diagnosis of SARS-CoV-2 by FDA under an Emergency Use Authorization (EUA). This EUA will remain in effect (meaning this test can be used) for the duration of the COVID-19 declaration under Section 564(b)(1) of the Act, 21 U.S.C. section 360bbb-3(b)(1), unless the authorization is terminated or revoked.  Performed at [facility], [address] [place], [place] [address]   Blood Culture (routine x 2)     Status: None (Preliminary result)   Collection Time: 06/29/2020  6:43 PM   Specimen: BLOOD  Result Value Ref Range Status   Specimen Description BLOOD RIGHT ANTECUBITAL  Final   Special Requests   Final    BOTTLES DRAWN AEROBIC ONLY Blood Culture adequate volume   Culture   Final    NO GROWTH 2 DAYS Performed at [facility], [address]  [place], [place] [address]    Report Status PENDING  Incomplete  Blood Culture (routine x 2)     Status: None (Preliminary result)   Collection Time: 06/19/2020  6:43 PM   Specimen: BLOOD  Result Value Ref Range Status   Specimen Description BLOOD LEFT ANTECUBITAL  Final   Special Requests   Final    BOTTLES DRAWN AEROBIC ONLY Blood Culture adequate volume   Culture   Final    NO GROWTH 2 DAYS Performed at [facility], [address], [place], [place] [address]    Report Status PENDING  Incomplete  Resp Panel by RT-PCR (Flu A&B, Covid) Nasopharyngeal Swab     Status: None   Collection Time: 06/29/2020  9:39 PM   Specimen: Nasopharyngeal Swab; Nasopharyngeal(NP) swabs in vial transport medium  Result Value Ref Range Status   SARS Coronavirus 2 by RT PCR NEGATIVE NEGATIVE Final    Comment: (NOTE) SARS-CoV-2 target nucleic acids are NOT DETECTED.  The SARS-CoV-2 RNA is generally detectable in upper respiratory specimens  during the acute phase of infection. The lowest concentration of SARS-CoV-2 viral copies this assay can detect is 138 copies/mL. A negative result does not preclude SARS-Cov-2 infection and should not be used as the sole basis for treatment or other patient management decisions. A negative result may occur with  improper specimen collection/handling, submission of specimen other than nasopharyngeal swab, presence of viral mutation(s) within the areas targeted by this assay, and inadequate number of viral copies(<138 copies/mL). A negative result must be combined with clinical observations, patient history, and epidemiological information. The expected result is Negative.  Fact Sheet for Patients:  EntrepreneurPulse.com.au  Fact Sheet for Healthcare Providers:  IncredibleEmployment.be  This test is no t yet approved or cleared by the [place] FDA and  has been authorized for detection and/or diagnosis of SARS-CoV-2  by FDA under an Emergency Use Authorization (EUA). This EUA will remain  in effect (meaning this test can be used) for the duration of the COVID-19 declaration under Section 564(b)(1) of the Act, 21 U.S.C.section 360bbb-3(b)(1), unless the authorization is terminated  or revoked sooner.       Influenza A by PCR NEGATIVE NEGATIVE Final   Influenza B by PCR NEGATIVE NEGATIVE Final    Comment: (NOTE) The Xpert Xpress SARS-CoV-2/FLU/RSV plus assay is intended as an aid in the diagnosis of influenza from Nasopharyngeal swab specimens and should not be used as a sole basis for treatment. Nasal washings and aspirates are unacceptable for Xpert Xpress SARS-CoV-2/FLU/RSV testing.  Fact Sheet for Patients: EntrepreneurPulse.com.au  Fact Sheet for Healthcare Providers: IncredibleEmployment.be  This test is not yet approved or cleared by the [place] FDA and has been authorized for detection and/or diagnosis of SARS-CoV-2 by FDA under an Emergency Use Authorization (EUA). This EUA will remain in effect (meaning this test can be used) for the duration of the COVID-19 declaration under Section 564(b)(1) of the Act, 21 U.S.C. section 360bbb-3(b)(1), unless the authorization is terminated or revoked.  Performed at [facility], [address] [place], [place] [address]   MRSA PCR Screening     Status: None   Collection Time: 06/23/20  2:45 AM   Specimen: Nasopharyngeal  Result Value Ref Range Status   MRSA by PCR NEGATIVE NEGATIVE Final    Comment:        The GeneXpert MRSA Assay (FDA approved for NASAL specimens only), is one component of a comprehensive MRSA colonization surveillance program. It is not intended to diagnose MRSA infection nor to guide or monitor treatment for MRSA infections. Performed at [facility], [address] [place], [place] [address]     Coagulation Studies: No results for input(s):  LABPROT, INR in the last 72 hours.  Urinalysis: Recent Labs    07/06/2020 2105  COLORURINE YELLOW*  LABSPEC 1.010  PHURINE 5.0  GLUCOSEU NEGATIVE  HGBUR NEGATIVE  BILIRUBINUR NEGATIVE  KETONESUR NEGATIVE  PROTEINUR >=300*  NITRITE NEGATIVE  LEUKOCYTESUR NEGATIVE      Imaging: DG Chest 1 View  Result Date: 06/26/2020 CLINICAL DATA:  Short of breath EXAM: CHEST  1 VIEW COMPARISON:  06/29/2020 FINDINGS: Single frontal view of the chest demonstrates persistent enlargement the cardiac silhouette. Worsening bilateral perihilar airspace disease. Small effusions have enlarged. No pneumothorax. IMPRESSION: 1. Findings consistent with progressive congestive heart failure and pulmonary edema. Electronically Signed   By: Randa Ngo M.D.   On: 07/12/2020 03:59   DG Chest 2 View  Result Date: 07/04/2020 CLINICAL DATA:  Chest pressure, shortness of breath and  intermittent nausea. EXAM: CHEST - 2 VIEW COMPARISON:  June 26, 2010 FINDINGS: Moderate to marked severity predominantly bilateral perihilar, bilateral suprahilar and bilateral infrahilar infiltrates are seen. Extension to involve the bilateral lung bases is also noted. There are small bilateral pleural effusions. No pneumothorax is identified. The cardiac silhouette is moderately enlarged. The visualized skeletal structures are unremarkable. IMPRESSION: 1. Moderate to marked severity bilateral infiltrates. 2. Small bilateral pleural effusions. Electronically Signed   By: Virgina Norfolk M.D.   On: 07/16/2020 15:28   DG Abd 1 View  Result Date: 06/20/2020 CLINICAL DATA:  OG tube EXAM: ABDOMEN - 1 VIEW COMPARISON:  None. FINDINGS: Esophageal tube tip and side port overlie the proximal to mid stomach. Small bilateral effusions. Perihilar airspace disease. IMPRESSION: Esophageal tube tip and side port overlie the proximal to mid stomach. Electronically Signed   By: Donavan Foil M.D.   On: 07/15/2020 20:31   US Renal  Result Date:  07/06/2020 CLINICAL DATA:  Acute renal failure. EXAM: RENAL / URINARY TRACT ULTRASOUND COMPLETE COMPARISON:  Ultrasound abdomen 12/29/2012, ultrasound renal 04/25/1999 FINDINGS: Right Kidney: Renal measurements: 7.4 x 3 x 4.1 cm = volume: 85 mL. Echogenicity is increased. No mass or hydronephrosis visualized. Left Kidney: Renal measurements: 8.8 x 4.6 x 4.3 cm = volume: 91 mL. Echogenicity is increased. Underlying mass within the left kidney cannot be excluded. Urinary bladder: Appears normal for degree of bladder distention. Other: None. IMPRESSION: Atrophic and echogenic bilateral kidneys suggesting renal parenchymal disease with an underlying mass within the left kidney not excluded. Recommend MRI or CT renal protocol for further evaluation. Electronically Signed   By: Iven Finn M.D.   On: 07/17/2020 21:23   DG Chest Port 1 View  Result Date: 06/23/2020 CLINICAL DATA:  Acute respiratory failure, hypoxia EXAM: PORTABLE CHEST 1 VIEW COMPARISON:  07/17/2020 FINDINGS: Endotracheal tube is seen 2.6 cm above the carina. Nasogastric tube extends into the upper abdomen beyond the margin of the examination. Esophageal Doppler probe appears to overlie the expected right para form sinus. Extensive bilateral perihilar and right basilar airspace infiltrate has improved slightly in the interval since prior examination. No pneumothorax or pleural effusion. Cardiac size within normal limits. IMPRESSION: Esophageal Doppler probe overlies the expected right piriform sinus. Endotracheal tube and nasogastric tube in expected position. Improving extensive multifocal pulmonary infiltrate. Electronically Signed   By: Fidela Salisbury MD   On: 06/23/2020 05:14   DG Chest Portable 1 View  Result Date: 07/19/2020 CLINICAL DATA:  Intubated EXAM: PORTABLE CHEST 1 VIEW COMPARISON:  07/07/2020, 06/26/2020, 07/05/2020 FINDINGS: Interval intubation, tip of the endotracheal tube is about a cm superior to the carina. Esophageal tube tip  below the diaphragm but incompletely visualized. Perihilar consolidations and airspace disease without significant change. Probable right pleural effusion. Basilar airspace disease. Stable cardiomediastinal silhouette. IMPRESSION: Interval intubation with tip of the endotracheal tube about a cm superior to the carina. No significant interval change in bilateral perihilar and basilar airspace disease which may reflect edema or pneumonia. Electronically Signed   By: Donavan Foil M.D.   On: 07/06/2020 20:30   DG Chest Portable 1 View  Result Date: 07/03/2020 CLINICAL DATA:  65 year old female with decreased O2 saturation. EXAM: PORTABLE CHEST 1 VIEW COMPARISON:  Chest radiograph dated 06/19/2020. FINDINGS: Cardiomegaly with vascular congestion and edema. Pneumonia is not excluded clinical correlation is recommended. Overall progression of pulmonary opacity on the right side. Probable small right pleural effusion. No pneumothorax. No acute osseous pathology. IMPRESSION: Cardiomegaly with findings  of CHF. Pneumonia is not excluded. Electronically Signed   By: Anner Crete M.D.   On: 07/12/2020 20:06   ECHOCARDIOGRAM COMPLETE  Result Date: 07/10/2020    ECHOCARDIOGRAM REPORT   Patient Name:   FERGIE SHERBERT PENA Date of Exam: 07/10/2020 Medical Rec #:  [id number]             Height:       61.0 in Accession #:    [id number]            Weight:       145.8 lb Date of Birth:  Apr 30, 1956            BSA:          1.651 m Patient Age:    16 years              BP:           113/96 mmHg Patient Gender: F                     HR:           99 bpm. Exam Location:  [facility] Procedure: 2D Echo, Color Doppler and Cardiac Doppler Indications:     I50.9 Congestive Heart Failure  History:         Patient has no prior history of Echocardiogram examinations.                  Risk Factors:Hypertension, Diabetes and Dyslipidemia.  Sonographer:     Charmayne Sheer RDCS (AE) Referring Phys:  [id number] Rhetta Mura Diagnosing Phys:  Nelva Bush MD  Sonographer Comments: Image acquisition challenging due to patient behavioral factors., Image acquisition challenging due to uncooperative patient and Image acquisition challenging due to respiratory motion. IMPRESSIONS  1. Left ventricular ejection fraction, by estimation, is 40 to 45%. The left ventricle has mildly decreased function. The left ventricle demonstrates global hypokinesis. The left ventricular internal cavity size was mildly dilated. There is mild left ventricular hypertrophy. Left ventricular diastolic parameters are indeterminate.  2. Right ventricular systolic function is normal. The right ventricular size is mildly enlarged. Mildly increased right ventricular wall thickness. There is moderately elevated pulmonary artery systolic pressure.  3. Left atrial size was mild to moderately dilated.  4. Right atrial size was moderately dilated.  5. A small pericardial effusion is present. The pericardial effusion is circumferential.  6. The mitral valve is abnormal. Mild to moderate mitral valve regurgitation. No evidence of mitral stenosis.  7. The tricuspid valve is degenerative. Tricuspid valve regurgitation is severe.  8. The aortic valve has an indeterminant number of cusps. There is mild thickening of the aortic valve. Aortic valve regurgitation is not visualized. No aortic stenosis is present.  9. The inferior vena cava is dilated in size with <50% respiratory variability, suggesting right atrial pressure of 15 mmHg. FINDINGS  Left Ventricle: Left ventricular ejection fraction, by estimation, is 40 to 45%. The left ventricle has mildly decreased function. The left ventricle demonstrates global hypokinesis. The left ventricular internal cavity size was mildly dilated. There is  mild left ventricular hypertrophy. Left ventricular diastolic parameters are indeterminate. Right Ventricle: The right ventricular size is mildly enlarged. Mildly increased right ventricular wall thickness.  Right ventricular systolic function is normal. There is moderately elevated pulmonary artery systolic pressure. The tricuspid regurgitant velocity is 3.09 m/s, and with an assumed right atrial pressure of 15 mmHg, the estimated right ventricular systolic pressure is 17.4 mmHg. Left  Atrium: Left atrial size was mild to moderately dilated. Right Atrium: Right atrial size was moderately dilated. Pericardium: A small pericardial effusion is present. The pericardial effusion is circumferential. Mitral Valve: The mitral valve is abnormal. There is mild thickening of the mitral valve leaflet(s). Mild to moderate mitral valve regurgitation. No evidence of mitral valve stenosis. MV peak gradient, 6.2 mmHg. The mean mitral valve gradient is 4.0 mmHg. Tricuspid Valve: The tricuspid valve is degenerative in appearance. Tricuspid valve regurgitation is severe. Aortic Valve: The aortic valve has an indeterminant number of cusps. There is mild thickening of the aortic valve. Aortic valve regurgitation is not visualized. No aortic stenosis is present. Aortic valve mean gradient measures 6.0 mmHg. Aortic valve peak gradient measures 10.9 mmHg. Aortic valve area, by VTI measures 2.36 cm. Pulmonic Valve: The pulmonic valve was grossly normal. Pulmonic valve regurgitation is trivial. No evidence of pulmonic stenosis. Aorta: The aortic root is normal in size and structure. Pulmonary Artery: The pulmonary artery is of normal size. Venous: The inferior vena cava is dilated in size with less than 50% respiratory variability, suggesting right atrial pressure of 15 mmHg. IAS/Shunts: The interatrial septum was not well visualized. Additional Comments: There is pleural effusion in the left lateral region.  LEFT VENTRICLE PLAX 2D LVIDd:         5.40 cm  Diastology LVIDs:         3.80 cm  LV e' lateral:   8.81 cm/s LV PW:         1.20 cm  LV E/e' lateral: 15.0 LV IVS:        1.18 cm LVOT diam:     2.20 cm LV SV:         71 LV SV Index:   43  LVOT Area:     3.80 cm  RIGHT VENTRICLE RV Basal diam:  4.10 cm LEFT ATRIUM           Index       RIGHT ATRIUM           Index LA diam:      4.80 cm 2.91 cm/m  RA Area:     25.90 cm LA Vol (A4C): 89.7 ml 54.32 ml/m RA Volume:   90.90 ml  55.05 ml/m  AORTIC VALVE                    PULMONIC VALVE AV Area (Vmax):    2.26 cm     PV Vmax:       1.26 m/s AV Area (Vmean):   2.11 cm     PV Vmean:      79.200 cm/s AV Area (VTI):     2.36 cm     PV VTI:        0.202 m AV Vmax:           165.00 cm/s  PV Peak grad:  6.4 mmHg AV Vmean:          114.000 cm/s PV Mean grad:  3.0 mmHg AV VTI:            0.300 m AV Peak Grad:      10.9 mmHg AV Mean Grad:      6.0 mmHg LVOT Vmax:         97.90 cm/s LVOT Vmean:        63.200 cm/s LVOT VTI:          0.186 m LVOT/AV VTI ratio: 0.62  AORTA Ao Root diam:  3.20 cm MITRAL VALVE                TRICUSPID VALVE MV Area (PHT): 6.65 cm     TR Peak grad:   38.2 mmHg MV Peak grad:  6.2 mmHg     TR Vmax:        309.00 cm/s MV Mean grad:  4.0 mmHg MV Vmax:       1.25 m/s     SHUNTS MV Vmean:      91.3 cm/s    Systemic VTI:  0.19 m MV Decel Time: 114 msec     Systemic Diam: 2.20 cm MV E velocity: 132.00 cm/s MV A velocity: 106.00 cm/s MV E/A ratio:  1.25 Nelva Bush MD Electronically signed by Nelva Bush MD Signature Date/Time: 07/04/2020/1:02:18 PM    Final      Medications:   . famotidine (PEPCID) IV Stopped (07/02/2020 2146)  . fentaNYL infusion INTRAVENOUS 100 mcg/hr (06/23/20 1250)  . norepinephrine (LEVOPHED) Adult infusion 3 mcg/min (06/23/20 1250)  . propofol (DIPRIVAN) infusion 25 mcg/kg/min (06/23/20 1250)  . vasopressin (PITRESSIN) 50 Units in sodium chloride 0.9 % 250 mL infusion - *FOR GI BLEED* Stopped (07/19/2020 2254)   . aspirin  81 mg Per Tube Daily  . chlorhexidine gluconate (MEDLINE KIT)  15 mL Mouth Rinse BID  . Chlorhexidine Gluconate Cloth  6 each Topical Q0600  . Chlorhexidine Gluconate Cloth  6 each Topical Q0600  . docusate  100 mg Per Tube BID  .  furosemide  40 mg Intravenous BID  . heparin  5,000 Units Subcutaneous Q8H  . hydrALAZINE  25 mg Per Tube BID  . hydrocortisone sod succinate (SOLU-CORTEF) inj  50 mg Intravenous Q6H  . insulin aspart  0-15 Units Subcutaneous Q4H  . mouth rinse  15 mL Mouth Rinse 10 times per day  . polyethylene glycol  17 g Per Tube Daily   acetaminophen **OR** acetaminophen, fentaNYL, haloperidol lactate, ipratropium-albuterol, labetalol, LORazepam, ondansetron (ZOFRAN) IV, vecuronium  Assessment/ Plan:  Ms. Stephanie Casey Stephanie Casey is a 65 y.o. 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   1. 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  Followed by [facility].  - Initial dialysis yesterday.  - Second dialysis treatment for today.  - Continue IV furosemide.   2. Acute respiratory failure with pulmonary edema requring intubation and mechanical ventilation.  Appreciate critical care input   3. Anemia with chronic kidney disease: hemoglobin 8.6 - start EPO with next HD treatment   LOS: 2 Rogina Schiano 1/5/20221:20 PM

## 2020-06-23 NOTE — Progress Notes (Signed)
CRITICAL CARE NOTE 65 y.o. 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  Failed trial of BiPAP in ED requiring emergent intubation.  1/4: Right Femoral Trialysis placed by Vascular Surgery  Significant Diagnostic Tests:  1/3: CXR>>Moderate to marked severity predominantly bilateral perihilar, bilateral suprahilar and bilateral infrahilar infiltrates are seen. Extension to involve the bilateral lung bases is also noted. There are small bilateral pleural effusions. No pneumothorax is identified. The cardiac silhouette is moderately enlarged. The visualized skeletal structures are unremarkable. 1/3: Renal US>>Atrophic and echogenic bilateral kidneys suggesting renal parenchymal disease with an underlying mass within the left kidney not excluded. Recommend MRI or CT renal protocol for further evaluation.  Micro Data:  1/3: SARS-CoV-2 PCR>>negative 1/3: Influenza A&B PCR>>negative 1/3: Blood culture x2>> 1/4: HIV Screen>>nonreactive 1/4: Hepatitis B Surface Ag>>nonreactive 1/4:Hepatitis B S ab>>nonreactive 1/4: Hepatitis B core Ab, IgM>>nonreactive 1/4: Hepatitis C Ab>>nonreactive      1/4 admitted to ICU for acute pulm edema 1/5 remains on vent did NOT tolerate HD   CC  follow up respiratory failure  SUBJECTIVE Patient remains critically ill Prognosis is guarded  Vent Mode: PRVC FiO2 (%):  [40 %-100 %] 40 % Set Rate:  [16 bmp] 16 bmp Vt Set:  [450 mL] 450 mL PEEP:  [8 cmH20-15 cmH20] 10 cmH20  CBC    Component Value Date/Time   WBC 14.1 (H) 06/23/2020 0559   RBC 2.80 (L) 06/23/2020 0559   HGB 8.6 (L) 06/23/2020 0559   HGB 12.4 01/15/2013 2354   HCT 27.4 (L) 06/23/2020 0559   HCT 37.6 01/15/2013 2354   PLT 278 06/23/2020 0559   PLT 265 01/15/2013 2354   MCV 97.9 06/23/2020 0559   MCV 85 01/15/2013 2354   MCH 30.7 06/23/2020 0559   MCHC 31.4 06/23/2020 0559   RDW  13.9 06/23/2020 0559   RDW 13.5 01/15/2013 2354   LYMPHSABS 0.5 (L) 07/07/2020 0223   MONOABS 0.4 07/15/2020 0223   EOSABS 0.0 07/18/2020 0223   BASOSABS 0.0 07/01/2020 0223   BMP Latest Ref Rng & Units 07/10/2020 07/01/2020 02/10/2019  Glucose 70 - 99 mg/dL 207(H) 146(H) 112(H)  BUN 8 - 23 mg/dL 80(H) 78(H) 20  Creatinine 0.44 - 1.00 mg/dL 6.60(H) 6.46(H) 1.70(H)  Sodium 135 - 145 mmol/L 134(L) 133(L) 140  Potassium 3.5 - 5.1 mmol/L 4.9 4.9 4.1  Chloride 98 - 111 mmol/L 97(L) 99 107  CO2 22 - 32 mmol/L 24 23 23   Calcium 8.9 - 10.3 mg/dL 8.6(L) 8.4(L) 8.3(L)    BP 116/68   Pulse 76   Temp 98.96 F (37.2 C)   Resp 16   Ht 5' 0.98" (1.549 m)   Wt 74.3 kg   SpO2 100%   BMI 30.97 kg/m    I/O last 3 completed shifts: In: 208.3 [I.V.:157.1; IV Piggyback:51.1] Out: 1046 [Urine:675; Other:371] No intake/output data recorded.  SpO2: 100 % O2 Flow Rate (L/min): 15 L/min FiO2 (%): 40 %  Estimated body mass index is 30.97 kg/m as calculated from the following:   Height as of this encounter: 5' 0.98" (1.549 m).   Weight as of this encounter: 74.3 kg.  SIGNIFICANT EVENTS   REVIEW OF SYSTEMS  PATIENT IS UNABLE TO PROVIDE COMPLETE REVIEW OF SYSTEMS DUE TO SEVERE CRITICAL ILLNESS        PHYSICAL EXAMINATION:  GENERAL:critically ill appearing, +resp distress HEAD: Normocephalic, atraumatic.  EYES: Pupils equal, round, reactive to light.  No  scleral icterus.  MOUTH: Moist mucosal membrane. NECK: Supple.  PULMONARY: +rhonchi, +wheezing CARDIOVASCULAR: S1 and S2. Regular rate and rhythm. No murmurs, rubs, or gallops.  GASTROINTESTINAL: Soft, nontender, -distended.  Positive bowel sounds.   MUSCULOSKELETAL: No swelling, clubbing, or edema.  NEUROLOGIC: obtunded, GCS<8 SKIN:intact,warm,dry  MEDICATIONS: I have reviewed all medications and confirmed regimen as documented   CULTURE RESULTS   Recent Results (from the past 240 hour(s))  Resp Panel by RT-PCR (Flu A&B, Covid)  Nasopharyngeal Swab     Status: None   Collection Time: 07/09/2020  6:43 PM   Specimen: Nasopharyngeal Swab; Nasopharyngeal(NP) swabs in vial transport medium  Result Value Ref Range Status   SARS Coronavirus 2 by RT PCR NEGATIVE NEGATIVE Final    Comment: (NOTE) SARS-CoV-2 target nucleic acids are NOT DETECTED.  The SARS-CoV-2 RNA is generally detectable in upper respiratory specimens during the acute phase of infection. The lowest concentration of SARS-CoV-2 viral copies this assay can detect is 138 copies/mL. A negative result does not preclude SARS-Cov-2 infection and should not be used as the sole basis for treatment or other patient management decisions. A negative result may occur with  improper specimen collection/handling, submission of specimen other than nasopharyngeal swab, presence of viral mutation(s) within the areas targeted by this assay, and inadequate number of viral copies(<138 copies/mL). A negative result must be combined with clinical observations, patient history, and epidemiological information. The expected result is Negative.  Fact Sheet for Patients:  EntrepreneurPulse.com.au  Fact Sheet for Healthcare Providers:  IncredibleEmployment.be  This test is no t yet approved or cleared by the [place] FDA and  has been authorized for detection and/or diagnosis of SARS-CoV-2 by FDA under an Emergency Use Authorization (EUA). This EUA will remain  in effect (meaning this test can be used) for the duration of the COVID-19 declaration under Section 564(b)(1) of the Act, 21 U.S.C.section 360bbb-3(b)(1), unless the authorization is terminated  or revoked sooner.       Influenza A by PCR NEGATIVE NEGATIVE Final   Influenza B by PCR NEGATIVE NEGATIVE Final    Comment: (NOTE) The Xpert Xpress SARS-CoV-2/FLU/RSV plus assay is intended as an aid in the diagnosis of influenza from Nasopharyngeal swab specimens and should not be  used as a sole basis for treatment. Nasal washings and aspirates are unacceptable for Xpert Xpress SARS-CoV-2/FLU/RSV testing.  Fact Sheet for Patients: EntrepreneurPulse.com.au  Fact Sheet for Healthcare Providers: IncredibleEmployment.be  This test is not yet approved or cleared by the [place] FDA and has been authorized for detection and/or diagnosis of SARS-CoV-2 by FDA under an Emergency Use Authorization (EUA). This EUA will remain in effect (meaning this test can be used) for the duration of the COVID-19 declaration under Section 564(b)(1) of the Act, 21 U.S.C. section 360bbb-3(b)(1), unless the authorization is terminated or revoked.  Performed at [facility], [address] [place], [place] [address]   Blood Culture (routine x 2)     Status: None (Preliminary result)   Collection Time: 07/02/2020  6:43 PM   Specimen: BLOOD  Result Value Ref Range Status   Specimen Description BLOOD RIGHT ANTECUBITAL  Final   Special Requests   Final    BOTTLES DRAWN AEROBIC ONLY Blood Culture adequate volume   Culture   Final    NO GROWTH 2 DAYS Performed at [facility], [address] [place], [place] [address]    Report Status PENDING  Incomplete  Blood Culture (routine x 2)  Status: None (Preliminary result)   Collection Time: 06/20/2020  6:43 PM   Specimen: BLOOD  Result Value Ref Range Status   Specimen Description BLOOD LEFT ANTECUBITAL  Final   Special Requests   Final    BOTTLES DRAWN AEROBIC ONLY Blood Culture adequate volume   Culture   Final    NO GROWTH 2 DAYS Performed at [facility], [address], [place], [place] [address]    Report Status PENDING  Incomplete  Resp Panel by RT-PCR (Flu A&B, Covid) Nasopharyngeal Swab     Status: None   Collection Time: 07/13/2020  9:39 PM   Specimen: Nasopharyngeal Swab; Nasopharyngeal(NP) swabs in vial transport medium  Result Value Ref Range Status    SARS Coronavirus 2 by RT PCR NEGATIVE NEGATIVE Final    Comment: (NOTE) SARS-CoV-2 target nucleic acids are NOT DETECTED.  The SARS-CoV-2 RNA is generally detectable in upper respiratory specimens during the acute phase of infection. The lowest concentration of SARS-CoV-2 viral copies this assay can detect is 138 copies/mL. A negative result does not preclude SARS-Cov-2 infection and should not be used as the sole basis for treatment or other patient management decisions. A negative result may occur with  improper specimen collection/handling, submission of specimen other than nasopharyngeal swab, presence of viral mutation(s) within the areas targeted by this assay, and inadequate number of viral copies(<138 copies/mL). A negative result must be combined with clinical observations, patient history, and epidemiological information. The expected result is Negative.  Fact Sheet for Patients:  EntrepreneurPulse.com.au  Fact Sheet for Healthcare Providers:  IncredibleEmployment.be  This test is no t yet approved or cleared by the [place] FDA and  has been authorized for detection and/or diagnosis of SARS-CoV-2 by FDA under an Emergency Use Authorization (EUA). This EUA will remain  in effect (meaning this test can be used) for the duration of the COVID-19 declaration under Section 564(b)(1) of the Act, 21 U.S.C.section 360bbb-3(b)(1), unless the authorization is terminated  or revoked sooner.       Influenza A by PCR NEGATIVE NEGATIVE Final   Influenza B by PCR NEGATIVE NEGATIVE Final    Comment: (NOTE) The Xpert Xpress SARS-CoV-2/FLU/RSV plus assay is intended as an aid in the diagnosis of influenza from Nasopharyngeal swab specimens and should not be used as a sole basis for treatment. Nasal washings and aspirates are unacceptable for Xpert Xpress SARS-CoV-2/FLU/RSV testing.  Fact Sheet for  Patients: EntrepreneurPulse.com.au  Fact Sheet for Healthcare Providers: IncredibleEmployment.be  This test is not yet approved or cleared by the [place] FDA and has been authorized for detection and/or diagnosis of SARS-CoV-2 by FDA under an Emergency Use Authorization (EUA). This EUA will remain in effect (meaning this test can be used) for the duration of the COVID-19 declaration under Section 564(b)(1) of the Act, 21 U.S.C. section 360bbb-3(b)(1), unless the authorization is terminated or revoked.  Performed at [facility], [address] [place], [place] [address]   MRSA PCR Screening     Status: None   Collection Time: 06/23/20  2:45 AM   Specimen: Nasopharyngeal  Result Value Ref Range Status   MRSA by PCR NEGATIVE NEGATIVE Final    Comment:        The GeneXpert MRSA Assay (FDA approved for NASAL specimens only), is one component of a comprehensive MRSA colonization surveillance program. It is not intended to diagnose MRSA infection nor to guide or monitor treatment for MRSA infections. Performed at [facility], [address] [place], [place] [address]           IMAGING    DG Abd 1 View  Result Date: 07/09/2020 CLINICAL DATA:  OG tube EXAM: ABDOMEN - 1 VIEW COMPARISON:  None. FINDINGS: Esophageal tube tip and side port overlie the proximal to mid stomach. Small bilateral effusions. Perihilar airspace disease. IMPRESSION: Esophageal tube tip and side port overlie the proximal to mid stomach. Electronically Signed   By: Donavan Foil M.D.   On: 06/23/2020 20:31   DG Chest Port 1 View  Result Date: 06/23/2020 CLINICAL DATA:  Acute respiratory failure, hypoxia EXAM: PORTABLE CHEST 1 VIEW COMPARISON:  06/28/2020 FINDINGS: Endotracheal tube is seen 2.6 cm above the carina. Nasogastric tube extends into the upper abdomen beyond the margin of the examination. Esophageal Doppler probe appears to overlie the  expected right para form sinus. Extensive bilateral perihilar and right basilar airspace infiltrate has improved slightly in the interval since prior examination. No pneumothorax or pleural effusion. Cardiac size within normal limits. IMPRESSION: Esophageal Doppler probe overlies the expected right piriform sinus. Endotracheal tube and nasogastric tube in expected position. Improving extensive multifocal pulmonary infiltrate. Electronically Signed   By: Fidela Salisbury MD   On: 06/23/2020 05:14   DG Chest Portable 1 View  Result Date: 06/20/2020 CLINICAL DATA:  Intubated EXAM: PORTABLE CHEST 1 VIEW COMPARISON:  07/19/2020, 06/26/2020, 06/27/2020 FINDINGS: Interval intubation, tip of the endotracheal tube is about a cm superior to the carina. Esophageal tube tip below the diaphragm but incompletely visualized. Perihilar consolidations and airspace disease without significant change. Probable right pleural effusion. Basilar airspace disease. Stable cardiomediastinal silhouette. IMPRESSION: Interval intubation with tip of the endotracheal tube about a cm superior to the carina. No significant interval change in bilateral perihilar and basilar airspace disease which may reflect edema or pneumonia. Electronically Signed   By: Donavan Foil M.D.   On: 07/02/2020 20:30   DG Chest Portable 1 View  Result Date: 07/16/2020 CLINICAL DATA:  65 year old female with decreased O2 saturation. EXAM: PORTABLE CHEST 1 VIEW COMPARISON:  Chest radiograph dated 07/06/2020. FINDINGS: Cardiomegaly with vascular congestion and edema. Pneumonia is not excluded clinical correlation is recommended. Overall progression of pulmonary opacity on the right side. Probable small right pleural effusion. No pneumothorax. No acute osseous pathology. IMPRESSION: Cardiomegaly with findings of CHF. Pneumonia is not excluded. Electronically Signed   By: Anner Crete M.D.   On: 07/03/2020 20:06   ECHOCARDIOGRAM COMPLETE  Result Date: 07/04/2020     ECHOCARDIOGRAM REPORT   Patient Name:   Stephanie Casey Date of Exam: 07/02/2020 Medical Rec #:  [id number]             Height:       61.0 in Accession #:    [id number]            Weight:       145.8 lb Date of Birth:  28-Dec-1955            BSA:          1.651 m Patient Age:    37 years              BP:           113/96 mmHg Patient Gender: F                     HR:           99 bpm. Exam Location:  [facility] Procedure: 2D Echo,  Color Doppler and Cardiac Doppler Indications:     I50.9 Congestive Heart Failure  History:         Patient has no prior history of Echocardiogram examinations.                  Risk Factors:Hypertension, Diabetes and Dyslipidemia.  Sonographer:     Charmayne Sheer RDCS (AE) Referring Phys:  [id number] Rhetta Mura Diagnosing Phys: Nelva Bush MD  Sonographer Comments: Image acquisition challenging due to patient behavioral factors., Image acquisition challenging due to uncooperative patient and Image acquisition challenging due to respiratory motion. IMPRESSIONS  1. Left ventricular ejection fraction, by estimation, is 40 to 45%. The left ventricle has mildly decreased function. The left ventricle demonstrates global hypokinesis. The left ventricular internal cavity size was mildly dilated. There is mild left ventricular hypertrophy. Left ventricular diastolic parameters are indeterminate.  2. Right ventricular systolic function is normal. The right ventricular size is mildly enlarged. Mildly increased right ventricular wall thickness. There is moderately elevated pulmonary artery systolic pressure.  3. Left atrial size was mild to moderately dilated.  4. Right atrial size was moderately dilated.  5. A small pericardial effusion is present. The pericardial effusion is circumferential.  6. The mitral valve is abnormal. Mild to moderate mitral valve regurgitation. No evidence of mitral stenosis.  7. The tricuspid valve is degenerative. Tricuspid valve regurgitation is severe.  8. The aortic  valve has an indeterminant number of cusps. There is mild thickening of the aortic valve. Aortic valve regurgitation is not visualized. No aortic stenosis is present.  9. The inferior vena cava is dilated in size with <50% respiratory variability, suggesting right atrial pressure of 15 mmHg. FINDINGS  Left Ventricle: Left ventricular ejection fraction, by estimation, is 40 to 45%. The left ventricle has mildly decreased function. The left ventricle demonstrates global hypokinesis. The left ventricular internal cavity size was mildly dilated. There is  mild left ventricular hypertrophy. Left ventricular diastolic parameters are indeterminate. Right Ventricle: The right ventricular size is mildly enlarged. Mildly increased right ventricular wall thickness. Right ventricular systolic function is normal. There is moderately elevated pulmonary artery systolic pressure. The tricuspid regurgitant velocity is 3.09 m/s, and with an assumed right atrial pressure of 15 mmHg, the estimated right ventricular systolic pressure is 03.1 mmHg. Left Atrium: Left atrial size was mild to moderately dilated. Right Atrium: Right atrial size was moderately dilated. Pericardium: A small pericardial effusion is present. The pericardial effusion is circumferential. Mitral Valve: The mitral valve is abnormal. There is mild thickening of the mitral valve leaflet(s). Mild to moderate mitral valve regurgitation. No evidence of mitral valve stenosis. MV peak gradient, 6.2 mmHg. The mean mitral valve gradient is 4.0 mmHg. Tricuspid Valve: The tricuspid valve is degenerative in appearance. Tricuspid valve regurgitation is severe. Aortic Valve: The aortic valve has an indeterminant number of cusps. There is mild thickening of the aortic valve. Aortic valve regurgitation is not visualized. No aortic stenosis is present. Aortic valve mean gradient measures 6.0 mmHg. Aortic valve peak gradient measures 10.9 mmHg. Aortic valve area, by VTI measures 2.36  cm. Pulmonic Valve: The pulmonic valve was grossly normal. Pulmonic valve regurgitation is trivial. No evidence of pulmonic stenosis. Aorta: The aortic root is normal in size and structure. Pulmonary Artery: The pulmonary artery is of normal size. Venous: The inferior vena cava is dilated in size with less than 50% respiratory variability, suggesting right atrial pressure of 15 mmHg. IAS/Shunts: The interatrial septum was not well visualized.  Additional Comments: There is pleural effusion in the left lateral region.  LEFT VENTRICLE PLAX 2D LVIDd:         5.40 cm  Diastology LVIDs:         3.80 cm  LV e' lateral:   8.81 cm/s LV PW:         1.20 cm  LV E/e' lateral: 15.0 LV IVS:        1.18 cm LVOT diam:     2.20 cm LV SV:         71 LV SV Index:   43 LVOT Area:     3.80 cm  RIGHT VENTRICLE RV Basal diam:  4.10 cm LEFT ATRIUM           Index       RIGHT ATRIUM           Index LA diam:      4.80 cm 2.91 cm/m  RA Area:     25.90 cm LA Vol (A4C): 89.7 ml 54.32 ml/m RA Volume:   90.90 ml  55.05 ml/m  AORTIC VALVE                    PULMONIC VALVE AV Area (Vmax):    2.26 cm     PV Vmax:       1.26 m/s AV Area (Vmean):   2.11 cm     PV Vmean:      79.200 cm/s AV Area (VTI):     2.36 cm     PV VTI:        0.202 m AV Vmax:           165.00 cm/s  PV Peak grad:  6.4 mmHg AV Vmean:          114.000 cm/s PV Mean grad:  3.0 mmHg AV VTI:            0.300 m AV Peak Grad:      10.9 mmHg AV Mean Grad:      6.0 mmHg LVOT Vmax:         97.90 cm/s LVOT Vmean:        63.200 cm/s LVOT VTI:          0.186 m LVOT/AV VTI ratio: 0.62  AORTA Ao Root diam: 3.20 cm MITRAL VALVE                TRICUSPID VALVE MV Area (PHT): 6.65 cm     TR Peak grad:   38.2 mmHg MV Peak grad:  6.2 mmHg     TR Vmax:        309.00 cm/s MV Mean grad:  4.0 mmHg MV Vmax:       1.25 m/s     SHUNTS MV Vmean:      91.3 cm/s    Systemic VTI:  0.19 m MV Decel Time: 114 msec     Systemic Diam: 2.20 cm MV E velocity: 132.00 cm/s MV A velocity: 106.00 cm/s MV E/A  ratio:  1.25 Nelva Bush MD Electronically signed by Nelva Bush MD Signature Date/Time: 07/03/2020/1:02:18 PM    Final      Nutrition Status:           Indwelling Urinary Catheter continued, requirement due to   Reason to continue Indwelling Urinary Catheter strict Intake/Output monitoring for hemodynamic instability   Central Line/ continued, requirement due to  Reason to continue [place] of central venous pressure or other hemodynamic parameters and  poor IV access   Ventilator continued, requirement due to severe respiratory failure   Ventilator Sedation RASS 0 to -2      ASSESSMENT AND PLAN SYNOPSIS   Severe ACUTE Hypoxic and Hypercapnic Respiratory Failure due to acute pulm edema due to acute sCHF and dCHF exacerbation -continue Full MV support -continue Bronchodilator Therapy -Wean Fio2 and PEEP as tolerated -will perform SAT/SBT when respiratory parameters are met -VAP/VENT bundle implementation  ACUTE SYSTOLIC CARDIAC FAILURE- echo pending -oxygen as needed -Lasix as tolerated   obesity, possible OSA.   Will certainly impact respiratory mechanics, ventilator weaning Suspect will need to consider additional PEEP  ACUTE KIDNEY INJURY/Renal Failure -continue Foley Catheter-assess need -Avoid nephrotoxic agents -Follow urine output, BMP -Ensure adequate renal perfusion, optimize oxygenation -Renal dose medications HD as needed    NEUROLOGY - intubated and sedated - minimal sedation to achieve a RASS goal: -1 Wake up assessment pending  CARDIAC ICU monitoring  GI GI PROPHYLAXIS as indicated  NUTRITIONAL STATUS Nutrition Status:         DIET-->TF's as tolerated Constipation protocol as indicated  ENDO - will use ICU hypoglycemic\Hyperglycemia protocol if indicated     ELECTROLYTES -follow labs as needed -replace as needed -pharmacy consultation and following   DVT/GI PRX ordered and assessed TRANSFUSIONS AS  NEEDED MONITOR FSBS I Assessed the need for Labs I Assessed the need for Foley I Assessed the need for Central Venous Line Family Discussion when available I Assessed the need for Mobilization I made an Assessment of medications to be adjusted accordingly Safety Risk assessment completed   CASE DISCUSSED IN MULTIDISCIPLINARY ROUNDS WITH ICU TEAM  Critical Care Time devoted to patient care services described in this note is 45 minutes.   Overall, patient is critically ill, prognosis is guarded.  Patient with Multiorgan failure and at high risk for cardiac arrest and death.    Corrin Parker, M.D.  [facility]  Medical Director [place] Director [facility] Cardio-Pulmonary Department

## 2020-06-23 NOTE — Plan of Care (Signed)

## 2020-06-23 NOTE — Progress Notes (Signed)
Patient noted to have different sized pupils (left 33mm and right 52mm) upon assessment which were very sluggish/non-reactive. The NP was notified and the patient was transported to CT for a head scan without issues to address this.  Patient's gold earring with clear stones removed from the right ear for CT. The earring was placed in a labeled specimen cup. The specimen cup was placed with the other patient belongings in the ICU cabinet in the room.  Cameron Ali, RN

## 2020-06-23 NOTE — Progress Notes (Signed)
Pt Responds to pain. Vecuronium administered prn for stacked breaths and de-saturation. Dialysis this shift, tolerated well. Titrated off levophed. Ativan administered prn during dialysis for agitation.

## 2020-06-23 NOTE — Progress Notes (Signed)
Initial Nutrition Assessment  DOCUMENTATION CODES:   Not applicable  INTERVENTION:   Vital 1.2 @45ml /hr + Pro-source 34ml daily via tube   Propofol: 11.1 ml/hr- provides 293kcal/day   Free water flushes 87ml q4 hours to maintain tube patency   Regimen provides 1336kcal/day, 92g/day protein and 1094ml/day free water   Liquid MVI daily via tube   Rena-vite daily via tube   Pt at high refeed risk; recommend monitor potassium, magnesium and phosphorus labs daily until stable  NUTRITION DIAGNOSIS:   Inadequate oral intake related to inability to eat (pt sedated and ventilated) as evidenced by NPO status.  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 OGT in place. Plan is to initiate tube feeds today.   Medications reviewed and include: aspirin, colace, lasix, solu-cortef, insulin, miralax, pepcid, fentanyl, levophed, propofol  Labs reviewed: K 4.4 wnl, BUN 63(H), creat 5.44(H) P 6.0(H), Mg 2.9(H)- 1/4 BNP- 4058(H) Wbc- 14.1(H), Hgb 8.6(L), Hct 27.4(L) cbgs- 81, 101, 129 x 24 hrs  AIC 5.3- 1/3  Patient is currently intubated on ventilator support MV: 7.5 L/min Temp (24hrs), Avg:99.3 F (37.4 C), Min:98.78 F (37.1 C), Max:100.22 F (37.9 C)  Propofol: 11.1 ml/hr- provides 293kcal/day   MAP- >34mmHg  UOP- 675ml  NUTRITION -  FOCUSED PHYSICAL EXAM:  Flowsheet Row Most Recent Value  Orbital Region No depletion  Upper Arm Region Mild depletion  Thoracic and Lumbar Region No depletion  Buccal Region No depletion  Temple Region Moderate depletion  Clavicle Bone Region Mild depletion  Clavicle and Acromion Bone Region Mild depletion  Scapular Bone Region No depletion  Dorsal Hand Mild depletion  Patellar Region Severe depletion  Anterior Thigh Region Severe depletion  Posterior Calf Region Severe depletion  Edema (RD Assessment) None  Hair Reviewed  Eyes Reviewed  Mouth Reviewed  Skin Reviewed  Nails Reviewed     Diet Order:   Diet Order            Diet NPO time specified  Diet effective now                EDUCATION NEEDS:   No education needs have been identified at this time  Skin:  Skin Assessment: Reviewed RN Assessment  Last BM:  1/4- type 1  Height:   Ht Readings from Last 1 Encounters:  07/05/2020 5' 0.98" (1.549 m)    Weight:   Wt Readings from Last 1 Encounters:  07/05/2020 74.3 kg    Ideal Body Weight:  47.7 kg  BMI:  Body mass index is 30.97 kg/m.  Estimated Nutritional Needs:   Kcal:  1340kcal/day  Protein:  80-90g/day  Fluid:  1.4-1.7L/day  Koleen Distance MS, RD, LDN Please refer to [facility] for RD and/or RD on-call/weekend/after hours pager

## 2020-06-24 ENCOUNTER — Inpatient Hospital Stay: Payer: Medicaid Other

## 2020-06-24 ENCOUNTER — Encounter: Payer: Self-pay | Admitting: Internal Medicine

## 2020-06-24 DIAGNOSIS — N051 Unspecified nephritic syndrome with focal and segmental glomerular lesions: Secondary | ICD-10-CM

## 2020-06-24 HISTORY — DX: Unspecified nephritic syndrome with focal and segmental glomerular lesions: N05.1

## 2020-06-24 LAB — CBC
HCT: 24.1 % — ABNORMAL LOW (ref 36.0–46.0)
Hemoglobin: 7.8 g/dL — ABNORMAL LOW (ref 12.0–15.0)
MCH: 30.7 pg (ref 26.0–34.0)
MCHC: 32.4 g/dL (ref 30.0–36.0)
MCV: 94.9 fL (ref 80.0–100.0)
Platelets: 253 10*3/uL (ref 150–400)
RBC: 2.54 MIL/uL — ABNORMAL LOW (ref 3.87–5.11)
RDW: 14 % (ref 11.5–15.5)
WBC: 15.9 10*3/uL — ABNORMAL HIGH (ref 4.0–10.5)
nRBC: 0 % (ref 0.0–0.2)

## 2020-06-24 LAB — BLOOD GAS, ARTERIAL
Acid-Base Excess: 0.7 mmol/L (ref 0.0–2.0)
Bicarbonate: 25.4 mmol/L (ref 20.0–28.0)
FIO2: 0.45
MECHVT: 450 mL
O2 Saturation: 89.5 %
PEEP: 5 cmH2O
Patient temperature: 37
RATE: 16 resp/min
pCO2 arterial: 40 mmHg (ref 32.0–48.0)
pH, Arterial: 7.41 (ref 7.350–7.450)
pO2, Arterial: 57 mmHg — ABNORMAL LOW (ref 83.0–108.0)

## 2020-06-24 LAB — PARATHYROID HORMONE, INTACT (NO CA): PTH: 296 pg/mL — ABNORMAL HIGH (ref 15–65)

## 2020-06-24 LAB — PROCALCITONIN: Procalcitonin: 34.68 ng/mL

## 2020-06-24 LAB — GLUCOSE, CAPILLARY
Glucose-Capillary: 193 mg/dL — ABNORMAL HIGH (ref 70–99)
Glucose-Capillary: 219 mg/dL — ABNORMAL HIGH (ref 70–99)
Glucose-Capillary: 217 mg/dL — ABNORMAL HIGH (ref 70–99)
Glucose-Capillary: 167 mg/dL — ABNORMAL HIGH (ref 70–99)
Glucose-Capillary: 149 mg/dL — ABNORMAL HIGH (ref 70–99)
Glucose-Capillary: 201 mg/dL — ABNORMAL HIGH (ref 70–99)

## 2020-06-24 LAB — BASIC METABOLIC PANEL
Anion gap: 13 (ref 5–15)
BUN: 46 mg/dL — ABNORMAL HIGH (ref 8–23)
CO2: 24 mmol/L (ref 22–32)
Calcium: 7.9 mg/dL — ABNORMAL LOW (ref 8.9–10.3)
Chloride: 100 mmol/L (ref 98–111)
Creatinine, Ser: 3.68 mg/dL — ABNORMAL HIGH (ref 0.44–1.00)
GFR, Estimated: 13 mL/min — ABNORMAL LOW (ref >60)
Glucose, Bld: 206 mg/dL — ABNORMAL HIGH (ref 70–99)
Potassium: 3.9 mmol/L (ref 3.5–5.1)
Sodium: 137 mmol/L (ref 135–145)

## 2020-06-24 LAB — PROTEIN ELECTRO, RANDOM URINE
Albumin ELP, Urine: 69.4 %
Alpha-1-Globulin, U: 2.4 %
Alpha-2-Globulin, U: 5.8 %
Beta Globulin, U: 9.5 %
Gamma Globulin, U: 13 %
Total Protein, Urine: 165.1 mg/dL

## 2020-06-24 LAB — PHOSPHORUS: Phosphorus: 5.2 mg/dL — ABNORMAL HIGH (ref 2.5–4.6)

## 2020-06-24 LAB — MAGNESIUM: Magnesium: 2.2 mg/dL (ref 1.7–2.4)

## 2020-06-24 LAB — TRIGLYCERIDES: Triglycerides: 77 mg/dL (ref <150)

## 2020-06-24 MED ORDER — ROCURONIUM BROMIDE 50 MG/5ML IV SOLN: 50.0000 mg | Freq: Once | INTRAVENOUS | Status: AC

## 2020-06-24 MED ORDER — ROCURONIUM BROMIDE 50 MG/5ML IV SOLN
INTRAVENOUS | Status: AC
  Administered 2020-06-24: 50 mg via INTRAVENOUS
  Filled 2020-06-24: qty 1

## 2020-06-24 MED ORDER — FENTANYL CITRATE (PF) 100 MCG/2ML IJ SOLN: 100.0000 ug | Freq: Once | INTRAMUSCULAR | Status: AC

## 2020-06-24 MED ORDER — ETOMIDATE 2 MG/ML IV SOLN
INTRAVENOUS | Status: AC
  Administered 2020-06-24: 20 mg via INTRAVENOUS
  Filled 2020-06-24: qty 10

## 2020-06-24 MED ORDER — DEXAMETHASONE SODIUM PHOSPHATE 10 MG/ML IJ SOLN
10.0000 mg | Freq: Once | INTRAMUSCULAR | Status: AC
  Administered 2020-06-24: 10 mg via INTRAVENOUS
  Filled 2020-06-24: qty 1

## 2020-06-24 MED ORDER — FENTANYL CITRATE (PF) 100 MCG/2ML IJ SOLN
INTRAMUSCULAR | Status: AC
  Administered 2020-06-24: 100 ug via INTRAVENOUS
  Filled 2020-06-24: qty 2

## 2020-06-24 MED ORDER — IPRATROPIUM-ALBUTEROL 0.5-2.5 (3) MG/3ML IN SOLN
3.0000 mL | Freq: Once | RESPIRATORY_TRACT | Status: AC
  Administered 2020-06-24: 3 mL via RESPIRATORY_TRACT
  Filled 2020-06-24: qty 3

## 2020-06-24 MED ORDER — RACEPINEPHRINE HCL 2.25 % IN NEBU
INHALATION_SOLUTION | RESPIRATORY_TRACT | Status: AC
  Filled 2020-06-24: qty 0.5

## 2020-06-24 MED ORDER — EPOETIN ALFA 10000 UNIT/ML IJ SOLN: 10000.0000 [IU] | INTRAMUSCULAR | Status: DC

## 2020-06-24 MED ORDER — ETOMIDATE 2 MG/ML IV SOLN: 20.0000 mg | Freq: Once | INTRAVENOUS | Status: AC

## 2020-06-24 NOTE — Procedures (Signed)
Endotracheal Intubation: Patient required placement of an artificial airway secondary to Respiratory Failure  Consent: Emergent.   Hand washing performed prior to starting the procedure.   Medications administered for sedation prior to procedure:  ETomidate  20 mg IV,  ROcuronium 50 mg IV, Fentanyl 100 mcg IV.    A time out procedure was called and correct patient, name, & ID confirmed. Needed supplies and equipment were assembled and checked to include ETT, 10 ml syringe, Glidescope, Mac and Miller blades, suction, oxygen and bag mask valve, end tidal CO2 monitor.   Patient was positioned to align the mouth and pharynx to facilitate visualization of the glottis.   Heart rate, SpO2 and blood pressure was continuously monitored during the procedure. Pre-oxygenation was conducted prior to intubation and endotracheal tube was placed through the vocal cords into the trachea.     The artificial airway was placed under direct visualization via glidescope route using a 7.5 ETT on the first attempt.  ETT was secured at 23 cm mark.  Placement was confirmed by auscuitation of lungs with good breath sounds bilaterally and no stomach sounds.  Condensation was noted on endotracheal tube.   Pulse ox 98%.  CO2 detector in place with appropriate color change.   Complications: None .   Operator: Yanelli Zapanta.   Chest radiograph ordered and pending.   Comments: OGT placed via glidescope.  Corrin Parker, M.D.  [facility]  Medical Director [place] Director [facility] Cardio-Pulmonary Department

## 2020-06-24 NOTE — Progress Notes (Signed)
Patient became agitated trying to pull off Bipap struggling to breathe.  Darlyn Chamber, NP at bedside wants patient reintubated.  Patient was successfully reintubated.

## 2020-06-24 NOTE — Progress Notes (Signed)
Pt. Self extubated . Placed pt. On bipap per Dr. Mortimer Fries on 05/25/11/40%.

## 2020-06-24 NOTE — Progress Notes (Signed)
CRITICAL CARE NOTE 65 y.o. 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 Failed trial of BiPAP in ED requiring emergent intubation.  1/4: Right Femoral Trialysis placed by Vascular Surgery  Significant Diagnostic Tests:  1/3: CXR>>Moderate to marked severity predominantly bilateral perihilar, bilateral suprahilar and bilateral infrahilar infiltrates are seen. Extension to involve the bilateral lung bases is also noted. There are small bilateral pleural effusions. No pneumothorax is identified. The cardiac silhouette is moderately enlarged. The visualized skeletal structures are unremarkable. 1/3: Renal US>>Atrophic and echogenic bilateral kidneys suggesting renal parenchymal disease with an underlying mass within the left kidney not excluded. Recommend MRI or CT renal protocol for further evaluation.  Micro Data:  1/3: SARS-CoV-2 PCR>>negative 1/3: Influenza A&B PCR>>negative 1/3: Blood culture x2>> 1/4: HIV Screen>>nonreactive 1/4: Hepatitis B Surface Ag>>nonreactive 1/4:Hepatitis B S ab>>nonreactive 1/4: Hepatitis B core Ab, IgM>>nonreactive 1/4: Hepatitis C Ab>>nonreactive      1/4 admitted to ICU for acute pulm edema 1/5 remains on vent did NOT tolerate HD 1/6 self extubated-on biPAP  CC  follow up respiratory failure  SUBJECTIVE Patient remains critically ill Prognosis is guarded   BP (!) 121/55   Pulse 72   Temp 98.96 F (37.2 C)   Resp 16   Ht 5' 0.98" (1.549 m)   Wt 59.6 kg   SpO2 94%   BMI 24.84 kg/m    I/O last 3 completed shifts: In: 1370.3 [I.V.:779.4; NG/GT:489.8; IV Piggyback:101.1] Out: 2196 [Urine:1325; Other:871] No intake/output data recorded.  SpO2: 94 % O2 Flow Rate (L/min): 15 L/min FiO2 (%): 60 %  Estimated body mass index is 24.84 kg/m as calculated from the following:   Height as of this encounter: 5' 0.98" (1.549 m).    Weight as of this encounter: 59.6 kg.  SIGNIFICANT EVENTS   REVIEW OF SYSTEMS  PATIENT IS UNABLE TO PROVIDE COMPLETE REVIEW OF SYSTEMS DUE TO SEVERE CRITICAL ILLNESS        PHYSICAL EXAMINATION:  GENERAL:critically ill appearing, +resp distress HEAD: Normocephalic, atraumatic.  EYES: Pupils equal, round, reactive to light.  No scleral icterus.  MOUTH: Moist mucosal membrane. NECK: Supple.  PULMONARY: +rhonchi, +wheezing CARDIOVASCULAR: S1 and S2. Regular rate and rhythm. No murmurs, rubs, or gallops.  GASTROINTESTINAL: Soft, nontender, -distended.  Positive bowel sounds.   MUSCULOSKELETAL: No swelling, clubbing, or edema.  NEUROLOGIC: obtunded SKIN:intact,warm,dry  MEDICATIONS: I have reviewed all medications and confirmed regimen as documented   CULTURE RESULTS   Recent Results (from the past 240 hour(s))  Resp Panel by RT-PCR (Flu A&B, Covid) Nasopharyngeal Swab     Status: None   Collection Time: 06/26/2020  6:43 PM   Specimen: Nasopharyngeal Swab; Nasopharyngeal(NP) swabs in vial transport medium  Result Value Ref Range Status   SARS Coronavirus 2 by RT PCR NEGATIVE NEGATIVE Final    Comment: (NOTE) SARS-CoV-2 target nucleic acids are NOT DETECTED.  The SARS-CoV-2 RNA is generally detectable in upper respiratory specimens during the acute phase of infection. The lowest concentration of SARS-CoV-2 viral copies this assay can detect is 138 copies/mL. A negative result does not preclude SARS-Cov-2 infection and should not be used as the sole basis for treatment or other patient management decisions. A negative result may occur with  improper specimen collection/handling, submission of specimen other than nasopharyngeal swab, presence of viral mutation(s) within the areas targeted by this assay, and inadequate number of viral copies(<138 copies/mL). A negative result must be combined with clinical  observations, patient history, and epidemiological information. The  expected result is Negative.  Fact Sheet for Patients:  EntrepreneurPulse.com.au  Fact Sheet for Healthcare Providers:  IncredibleEmployment.be  This test is no t yet approved or cleared by the [place] FDA and  has been authorized for detection and/or diagnosis of SARS-CoV-2 by FDA under an Emergency Use Authorization (EUA). This EUA will remain  in effect (meaning this test can be used) for the duration of the COVID-19 declaration under Section 564(b)(1) of the Act, 21 U.S.C.section 360bbb-3(b)(1), unless the authorization is terminated  or revoked sooner.       Influenza A by PCR NEGATIVE NEGATIVE Final   Influenza B by PCR NEGATIVE NEGATIVE Final    Comment: (NOTE) The Xpert Xpress SARS-CoV-2/FLU/RSV plus assay is intended as an aid in the diagnosis of influenza from Nasopharyngeal swab specimens and should not be used as a sole basis for treatment. Nasal washings and aspirates are unacceptable for Xpert Xpress SARS-CoV-2/FLU/RSV testing.  Fact Sheet for Patients: EntrepreneurPulse.com.au  Fact Sheet for Healthcare Providers: IncredibleEmployment.be  This test is not yet approved or cleared by the [place] FDA and has been authorized for detection and/or diagnosis of SARS-CoV-2 by FDA under an Emergency Use Authorization (EUA). This EUA will remain in effect (meaning this test can be used) for the duration of the COVID-19 declaration under Section 564(b)(1) of the Act, 21 U.S.C. section 360bbb-3(b)(1), unless the authorization is terminated or revoked.  Performed at [facility], [address] [place], [place] [address]   Blood Culture (routine x 2)     Status: None (Preliminary result)   Collection Time: 07/19/2020  6:43 PM   Specimen: BLOOD  Result Value Ref Range Status   Specimen Description BLOOD RIGHT ANTECUBITAL  Final   Special Requests   Final    BOTTLES DRAWN  AEROBIC ONLY Blood Culture adequate volume   Culture   Final    NO GROWTH 3 DAYS Performed at [facility], [address] [place], [place] [address]    Report Status PENDING  Incomplete  Blood Culture (routine x 2)     Status: None (Preliminary result)   Collection Time: 06/20/2020  6:43 PM   Specimen: BLOOD  Result Value Ref Range Status   Specimen Description BLOOD LEFT ANTECUBITAL  Final   Special Requests   Final    BOTTLES DRAWN AEROBIC ONLY Blood Culture adequate volume   Culture   Final    NO GROWTH 3 DAYS Performed at [facility], [address], [place],  [address]    Report Status PENDING  Incomplete  Resp Panel by RT-PCR (Flu A&B, Covid) Nasopharyngeal Swab     Status: None   Collection Time: 07/07/2020  9:39 PM   Specimen: Nasopharyngeal Swab; Nasopharyngeal(NP) swabs in vial transport medium  Result Value Ref Range Status   SARS Coronavirus 2 by RT PCR NEGATIVE NEGATIVE Final    Comment: (NOTE) SARS-CoV-2 target nucleic acids are NOT DETECTED.  The SARS-CoV-2 RNA is generally detectable in upper respiratory specimens during the acute phase of infection. The lowest concentration of SARS-CoV-2 viral copies this assay can detect is 138 copies/mL. A negative result does not preclude SARS-Cov-2 infection and should not be used as the sole basis for treatment or other patient management decisions. A negative result may occur with  improper specimen collection/handling, submission of specimen other than nasopharyngeal swab, presence of viral mutation(s) within the areas targeted by this assay, and inadequate number of viral  copies(<138 copies/mL). A negative result must be combined with clinical observations, patient history, and epidemiological information. The expected result is Negative.  Fact Sheet for Patients:  EntrepreneurPulse.com.au  Fact Sheet for Healthcare Providers:   IncredibleEmployment.be  This test is no t yet approved or cleared by the [place] FDA and  has been authorized for detection and/or diagnosis of SARS-CoV-2 by FDA under an Emergency Use Authorization (EUA). This EUA will remain  in effect (meaning this test can be used) for the duration of the COVID-19 declaration under Section 564(b)(1) of the Act, 21 U.S.C.section 360bbb-3(b)(1), unless the authorization is terminated  or revoked sooner.       Influenza A by PCR NEGATIVE NEGATIVE Final   Influenza B by PCR NEGATIVE NEGATIVE Final    Comment: (NOTE) The Xpert Xpress SARS-CoV-2/FLU/RSV plus assay is intended as an aid in the diagnosis of influenza from Nasopharyngeal swab specimens and should not be used as a sole basis for treatment. Nasal washings and aspirates are unacceptable for Xpert Xpress SARS-CoV-2/FLU/RSV testing.  Fact Sheet for Patients: EntrepreneurPulse.com.au  Fact Sheet for Healthcare Providers: IncredibleEmployment.be  This test is not yet approved or cleared by the [place] FDA and has been authorized for detection and/or diagnosis of SARS-CoV-2 by FDA under an Emergency Use Authorization (EUA). This EUA will remain in effect (meaning this test can be used) for the duration of the COVID-19 declaration under Section 564(b)(1) of the Act, 21 U.S.C. section 360bbb-3(b)(1), unless the authorization is terminated or revoked.  Performed at [facility], [address] [place], [place] [address]   MRSA PCR Screening     Status: None   Collection Time: 06/23/20  2:45 AM   Specimen: Nasopharyngeal  Result Value Ref Range Status   MRSA by PCR NEGATIVE NEGATIVE Final    Comment:        The GeneXpert MRSA Assay (FDA approved for NASAL specimens only), is one component of a comprehensive MRSA colonization surveillance program. It is not intended to diagnose MRSA infection nor to guide  or monitor treatment for MRSA infections. Performed at [facility], [address] [place], [place] [address]           IMAGING    CT HEAD WO CONTRAST  Result Date: 06/23/2020 CLINICAL DATA:  Acute hypoxic respiratory failure, acute renal insufficiency, altered level of consciousness, intubated EXAM: CT HEAD WITHOUT CONTRAST TECHNIQUE: Contiguous axial images were obtained from the base of the skull through the vertex without intravenous contrast. COMPARISON:  06/26/2010 FINDINGS: Brain: No acute infarct or hemorrhage. Lateral ventricles and midline structures are unremarkable. No acute extra-axial fluid collections. No mass effect. Vascular: Diffuse atherosclerosis of the internal carotid arteries. No hyperdense vessel. Skull: Normal. Negative for fracture or focal lesion. Sinuses/Orbits: No acute finding. Other: None. IMPRESSION: 1. No acute intracranial process. Electronically Signed   By: Randa Ngo M.D.   On: 06/23/2020 21:53   DG Chest Port 1 View  Result Date: 06/24/2020 CLINICAL DATA:  Hypoxia EXAM: PORTABLE CHEST 1 VIEW COMPARISON:  June 23, 2020. FINDINGS: Endotracheal tube tip is 3.5 cm above the carina. Nasogastric tube tip and side port are below the diaphragm. No pneumothorax. There has been significant clearing of airspace opacity from the left upper lobe and mid lung region with mild residual opacity in the left upper and mid lung regions remaining. Airspace opacity has partially but incompletely cleared from the right upper lobe. There is fairly extensive airspace opacity in the right mid and  lower lung regions. There is atelectatic change in the left lower lobe, slightly increased. Heart is upper normal in size with pulmonary vascularity normal. No adenopathy. There is aortic atherosclerosis. No bone lesions. IMPRESSION: Tube positions as described without pneumothorax. Multifocal airspace opacity with areas of partial clearing, particularly in the right upper  lobe and left upper lobe and mid lung regions. More extensive opacity remains in the right mid lung and right base regions. Stable cardiac silhouette. Aortic Atherosclerosis (ICD10-I70.0). Electronically Signed   By: Lowella Grip III M.D.   On: 06/24/2020 10:32     Nutrition Status: Nutrition Problem: Inadequate oral intake Etiology: inability to eat (pt sedated and ventilated) Signs/Symptoms: NPO status       Indwelling Urinary Catheter continued, requirement due to   Reason to continue Indwelling Urinary Catheter strict Intake/Output monitoring for hemodynamic instability   Central Line/ continued, requirement due to  Reason to continue [place] of central venous pressure or other hemodynamic parameters and poor IV access   Ventilator continued, requirement due to severe respiratory failure   Ventilator Sedation RASS 0 to -2      ASSESSMENT AND PLAN SYNOPSIS  Severe ACUTE Hypoxic and Hypercapnic Respiratory Failure due to acute pulm edema due to acute sCHF and dCHF exacerbation, self extubated plan for biPAP  VERY HIGH RISK FOR INTUBATION  ACUTE SYSTOLIC/Diastolic  CARDIAC FAILURE- -oxygen as needed -Lasix as tolerated   End stage KIDNEY INJURY/Renal Failure -continue Foley Catheter-assess need -Avoid nephrotoxic agents -Follow urine output, BMP -Ensure adequate renal perfusion, optimize oxygenation -Renal dose medications HD as needed    NEUROLOGY Acute toxic metabolic encephalopathy due to hypoxia and hypercapnia  CARDIAC ICU monitoring   GI GI PROPHYLAXIS as indicated  NUTRITIONAL STATUS Nutrition Status: Nutrition Problem: Inadequate oral intake Etiology: inability to eat (pt sedated and ventilated) Signs/Symptoms: NPO status     DIET-->TF's as tolerated Constipation protocol as indicated  ENDO - will use ICU hypoglycemic\Hyperglycemia protocol if indicated     ELECTROLYTES -follow labs as needed -replace as  needed -pharmacy consultation and following   DVT/GI PRX ordered and assessed TRANSFUSIONS AS NEEDED MONITOR FSBS I Assessed the need for Labs I Assessed the need for Foley I Assessed the need for Central Venous Line Family Discussion when available I Assessed the need for Mobilization I made an Assessment of medications to be adjusted accordingly Safety Risk assessment completed   CASE DISCUSSED IN MULTIDISCIPLINARY ROUNDS WITH ICU TEAM  Critical Care Time devoted to patient care services described in this note is 75  minutes.   Overall, patient is critically ill, prognosis is guarded.  Patient with Multiorgan failure and at high risk for cardiac arrest and death.    Corrin Parker, M.D.  [facility]  Medical Director [place] Director [facility] Cardio-Pulmonary Department

## 2020-06-24 NOTE — Progress Notes (Signed)
ICU PROGRESS  PATIENT SELF EXTUBATED FAILED BIPAP NEEDS EMERGENT INTUBATION  Patient remains unresponsive and will not open eyes to command.   Upon assessment his breath sounds are course crackles with significant secretions to oral pharyngeal region.  Nasopharyngeal suction produced copious sanguineous secretions.    Patient is having a weak cough and struggling to remove secretions.   patient with increased WOB and using accessory muscles to breathe Explained to family course of therapy and the modalities     Patient with Progressive multiorgan failure with very low chance of meaningful recovery despite all aggressive and optimal medical therapy.   No family available.  Family are satisfied with Plan of action and management. All questions answered  Additional CC time 32 mins   Sophie Tamez Patricia Pesa, M.D.  [facility]  Medical Director [place] Director [facility] Cardio-Pulmonary Department    \

## 2020-06-24 NOTE — Progress Notes (Signed)
[facility]  ROUNDING NOTE   Subjective:   Second hemodialysis treatment yesterday. UF of 575m. UOP 6550m  Weaned off norepinephrine  Objective:  Vital signs in last 24 hours:  Temp:  [98.78 F (37.1 C)-100.58 F (38.1 C)] 98.96 F (37.2 C) (01/06 0600) Pulse Rate:  [72-87] 72 (01/06 0600) Resp:  [15-20] 16 (01/06 0600) BP: (119-147)/(55-97) 121/55 (01/06 0600) SpO2:  [90 %-97 %] 94 % (01/06 0744) FiO2 (%):  [40 %-60 %] 60 % (01/06 1100) Weight:  [59.6 kg] 59.6 kg (01/06 0116)  Weight change: -6.534 kg Filed Weights   07/04/2020 0718 07/19/2020 2009 06/24/20 0116  Weight: 66.1 kg 74.3 kg 59.6 kg    Intake/Output: I/O last 3 completed shifts: In: 1370.3 [I.V.:779.4; NG/GT:489.8; IV Piggyback:101.1] Out: 2196 [Urine:1325; Other:871]   Intake/Output this shift:  No intake/output data recorded.  Physical Exam: General: Critically ill  Head: ETT  Eyes: Anicteric, PERRL  Neck: Supple, trachea midline  Lungs:  Bilateral crackles, PRVC 50%  Heart: Regular rate and rhythm  Abdomen:  Soft, obese  Extremities: + peripheral edema.  Neurologic: Intubated and sedated  Skin: No lesions  Access: Right femoral temp HD catheter 1/4 Dr. ScDelana Meyer  Basic Metabolic Panel: Recent Labs  Lab 07/03/2020 1454 07/07/2020 1843 07/08/2020 0223 06/23/20 0559 06/24/20 0326  NA 133*  --  134* 138 137  K 4.9  --  4.9 4.4 3.9  CL 99  --  97* 102 100  CO2 23  --  24 20* 24  GLUCOSE 146*  --  207* 96 206*  BUN 78*  --  80* 63* 46*  CREATININE 6.46*  --  6.60* 5.44* 3.68*  CALCIUM 8.4*  --  8.6* 8.3* 7.9*  MG  --  3.0* 2.9*  --  2.2  PHOS  --   --  6.0*  --  5.2*    Liver Function Tests: Recent Labs  Lab 07/13/2020 1454 06/27/2020 0223  AST 31 28  ALT 38 37  ALKPHOS 121 131*  BILITOT 0.8 0.9  PROT 6.9 7.3  ALBUMIN 3.3* 3.4*   No results for input(s): LIPASE, AMYLASE in the last 168 hours. No results for input(s): AMMONIA in the last 168 hours.  CBC: Recent Labs  Lab  07/08/2020 1454 07/08/2020 0223 07/04/2020 2130 06/23/20 0559 06/24/20 0326  WBC 10.1 12.4* 5.7 14.1* 15.9*  NEUTROABS  --  11.4*  --   --   --   HGB 9.9* 10.1* 8.2* 8.6* 7.8*  HCT 29.8* 30.6* 25.0* 27.4* 24.1*  MCV 93.4 93.9 94.3 97.9 94.9  PLT 373 399 269 278 253    Cardiac Enzymes: No results for input(s): CKTOTAL, CKMB, CKMBINDEX, TROPONINI in the last 168 hours.  BNP: Invalid input(s): POCBNP  CBG: Recent Labs  Lab 06/23/20 1904 06/23/20 2310 06/24/20 0318 06/24/20 0744 06/24/20 1112  GLUCAP 96 142* 193* 219* 217*    Microbiology: Results for orders placed or performed during the hospital encounter of 06/29/2020  Resp Panel by RT-PCR (Flu A&B, Covid) Nasopharyngeal Swab     Status: None   Collection Time: 07/14/2020  6:43 PM   Specimen: Nasopharyngeal Swab; Nasopharyngeal(NP) swabs in vial transport medium  Result Value Ref Range Status   SARS Coronavirus 2 by RT PCR NEGATIVE NEGATIVE Final    Comment: (NOTE) SARS-CoV-2 target nucleic acids are NOT DETECTED.  The SARS-CoV-2 RNA is generally detectable in upper respiratory specimens during the acute phase of infection. The lowest concentration of SARS-CoV-2 viral copies  this assay can detect is 138 copies/mL. A negative result does not preclude SARS-Cov-2 infection and should not be used as the sole basis for treatment or other patient management decisions. A negative result may occur with  improper specimen collection/handling, submission of specimen other than nasopharyngeal swab, presence of viral mutation(s) within the areas targeted by this assay, and inadequate number of viral copies(<138 copies/mL). A negative result must be combined with clinical observations, patient history, and epidemiological information. The expected result is Negative.  Fact Sheet for Patients:  EntrepreneurPulse.com.au  Fact Sheet for Healthcare Providers:  IncredibleEmployment.be  This test is no  t yet approved or cleared by the [place] FDA and  has been authorized for detection and/or diagnosis of SARS-CoV-2 by FDA under an Emergency Use Authorization (EUA). This EUA will remain  in effect (meaning this test can be used) for the duration of the COVID-19 declaration under Section 564(b)(1) of the Act, 21 U.S.C.section 360bbb-3(b)(1), unless the authorization is terminated  or revoked sooner.       Influenza A by PCR NEGATIVE NEGATIVE Final   Influenza B by PCR NEGATIVE NEGATIVE Final    Comment: (NOTE) The Xpert Xpress SARS-CoV-2/FLU/RSV plus assay is intended as an aid in the diagnosis of influenza from Nasopharyngeal swab specimens and should not be used as a sole basis for treatment. Nasal washings and aspirates are unacceptable for Xpert Xpress SARS-CoV-2/FLU/RSV testing.  Fact Sheet for Patients: EntrepreneurPulse.com.au  Fact Sheet for Healthcare Providers: IncredibleEmployment.be  This test is not yet approved or cleared by the [place] FDA and has been authorized for detection and/or diagnosis of SARS-CoV-2 by FDA under an Emergency Use Authorization (EUA). This EUA will remain in effect (meaning this test can be used) for the duration of the COVID-19 declaration under Section 564(b)(1) of the Act, 21 U.S.C. section 360bbb-3(b)(1), unless the authorization is terminated or revoked.  Performed at [facility], [address] [place], [place] [address]   Blood Culture (routine x 2)     Status: None (Preliminary result)   Collection Time: 07/03/2020  6:43 PM   Specimen: BLOOD  Result Value Ref Range Status   Specimen Description BLOOD RIGHT ANTECUBITAL  Final   Special Requests   Final    BOTTLES DRAWN AEROBIC ONLY Blood Culture adequate volume   Culture   Final    NO GROWTH 3 DAYS Performed at [facility], [address], [place], [place] [address]    Report Status PENDING  Incomplete   Blood Culture (routine x 2)     Status: None (Preliminary result)   Collection Time: 07/18/2020  6:43 PM   Specimen: BLOOD  Result Value Ref Range Status   Specimen Description BLOOD LEFT ANTECUBITAL  Final   Special Requests   Final    BOTTLES DRAWN AEROBIC ONLY Blood Culture adequate volume   Culture   Final    NO GROWTH 3 DAYS Performed at [facility], [address], [place],  [address]    Report Status PENDING  Incomplete  Resp Panel by RT-PCR (Flu A&B, Covid) Nasopharyngeal Swab     Status: None   Collection Time: 07/18/2020  9:39 PM   Specimen: Nasopharyngeal Swab; Nasopharyngeal(NP) swabs in vial transport medium  Result Value Ref Range Status   SARS Coronavirus 2 by RT PCR NEGATIVE NEGATIVE Final    Comment: (NOTE) SARS-CoV-2 target nucleic acids are NOT DETECTED.  The SARS-CoV-2 RNA is generally detectable in upper respiratory specimens during the acute  phase of infection. The lowest concentration of SARS-CoV-2 viral copies this assay can detect is 138 copies/mL. A negative result does not preclude SARS-Cov-2 infection and should not be used as the sole basis for treatment or other patient management decisions. A negative result may occur with  improper specimen collection/handling, submission of specimen other than nasopharyngeal swab, presence of viral mutation(s) within the areas targeted by this assay, and inadequate number of viral copies(<138 copies/mL). A negative result must be combined with clinical observations, patient history, and epidemiological information. The expected result is Negative.  Fact Sheet for Patients:  EntrepreneurPulse.com.au  Fact Sheet for Healthcare Providers:  IncredibleEmployment.be  This test is no t yet approved or cleared by the [place] FDA and  has been authorized for detection and/or diagnosis of SARS-CoV-2 by FDA under an Emergency Use Authorization (EUA). This EUA will  remain  in effect (meaning this test can be used) for the duration of the COVID-19 declaration under Section 564(b)(1) of the Act, 21 U.S.C.section 360bbb-3(b)(1), unless the authorization is terminated  or revoked sooner.       Influenza A by PCR NEGATIVE NEGATIVE Final   Influenza B by PCR NEGATIVE NEGATIVE Final    Comment: (NOTE) The Xpert Xpress SARS-CoV-2/FLU/RSV plus assay is intended as an aid in the diagnosis of influenza from Nasopharyngeal swab specimens and should not be used as a sole basis for treatment. Nasal washings and aspirates are unacceptable for Xpert Xpress SARS-CoV-2/FLU/RSV testing.  Fact Sheet for Patients: EntrepreneurPulse.com.au  Fact Sheet for Healthcare Providers: IncredibleEmployment.be  This test is not yet approved or cleared by the [place] FDA and has been authorized for detection and/or diagnosis of SARS-CoV-2 by FDA under an Emergency Use Authorization (EUA). This EUA will remain in effect (meaning this test can be used) for the duration of the COVID-19 declaration under Section 564(b)(1) of the Act, 21 U.S.C. section 360bbb-3(b)(1), unless the authorization is terminated or revoked.  Performed at [facility], [address] [place], [place] [address]   MRSA PCR Screening     Status: None   Collection Time: 06/23/20  2:45 AM   Specimen: Nasopharyngeal  Result Value Ref Range Status   MRSA by PCR NEGATIVE NEGATIVE Final    Comment:        The GeneXpert MRSA Assay (FDA approved for NASAL specimens only), is one component of a comprehensive MRSA colonization surveillance program. It is not intended to diagnose MRSA infection nor to guide or monitor treatment for MRSA infections. Performed at [facility], [address] [place], [place] [address]     Coagulation Studies: No results for input(s): LABPROT, INR in the last 72 hours.  Urinalysis: Recent Labs     07/06/2020 2105  COLORURINE YELLOW*  LABSPEC 1.010  PHURINE 5.0  GLUCOSEU NEGATIVE  HGBUR NEGATIVE  BILIRUBINUR NEGATIVE  KETONESUR NEGATIVE  PROTEINUR >=300*  NITRITE NEGATIVE  LEUKOCYTESUR NEGATIVE      Imaging: DG Abd 1 View  Result Date: 07/14/2020 CLINICAL DATA:  OG tube EXAM: ABDOMEN - 1 VIEW COMPARISON:  None. FINDINGS: Esophageal tube tip and side port overlie the proximal to mid stomach. Small bilateral effusions. Perihilar airspace disease. IMPRESSION: Esophageal tube tip and side port overlie the proximal to mid stomach. Electronically Signed   By: Donavan Foil M.D.   On: 07/09/2020 20:31   CT HEAD WO CONTRAST  Result Date: 06/23/2020 CLINICAL DATA:  Acute hypoxic respiratory failure, acute renal insufficiency, altered level of consciousness, intubated EXAM: CT  HEAD WITHOUT CONTRAST TECHNIQUE: Contiguous axial images were obtained from the base of the skull through the vertex without intravenous contrast. COMPARISON:  06/26/2010 FINDINGS: Brain: No acute infarct or hemorrhage. Lateral ventricles and midline structures are unremarkable. No acute extra-axial fluid collections. No mass effect. Vascular: Diffuse atherosclerosis of the internal carotid arteries. No hyperdense vessel. Skull: Normal. Negative for fracture or focal lesion. Sinuses/Orbits: No acute finding. Other: None. IMPRESSION: 1. No acute intracranial process. Electronically Signed   By: Randa Ngo M.D.   On: 06/23/2020 21:53   DG Chest Port 1 View  Result Date: 06/24/2020 CLINICAL DATA:  Hypoxia EXAM: PORTABLE CHEST 1 VIEW COMPARISON:  June 23, 2020. FINDINGS: Endotracheal tube tip is 3.5 cm above the carina. Nasogastric tube tip and side port are below the diaphragm. No pneumothorax. There has been significant clearing of airspace opacity from the left upper lobe and mid lung region with mild residual opacity in the left upper and mid lung regions remaining. Airspace opacity has partially but incompletely cleared  from the right upper lobe. There is fairly extensive airspace opacity in the right mid and lower lung regions. There is atelectatic change in the left lower lobe, slightly increased. Heart is upper normal in size with pulmonary vascularity normal. No adenopathy. There is aortic atherosclerosis. No bone lesions. IMPRESSION: Tube positions as described without pneumothorax. Multifocal airspace opacity with areas of partial clearing, particularly in the right upper lobe and left upper lobe and mid lung regions. More extensive opacity remains in the right mid lung and right base regions. Stable cardiac silhouette. Aortic Atherosclerosis (ICD10-I70.0). Electronically Signed   By: Lowella Grip III M.D.   On: 06/24/2020 10:32   DG Chest Port 1 View  Result Date: 06/23/2020 CLINICAL DATA:  Acute respiratory failure, hypoxia EXAM: PORTABLE CHEST 1 VIEW COMPARISON:  07/18/2020 FINDINGS: Endotracheal tube is seen 2.6 cm above the carina. Nasogastric tube extends into the upper abdomen beyond the margin of the examination. Esophageal Doppler probe appears to overlie the expected right para form sinus. Extensive bilateral perihilar and right basilar airspace infiltrate has improved slightly in the interval since prior examination. No pneumothorax or pleural effusion. Cardiac size within normal limits. IMPRESSION: Esophageal Doppler probe overlies the expected right piriform sinus. Endotracheal tube and nasogastric tube in expected position. Improving extensive multifocal pulmonary infiltrate. Electronically Signed   By: Fidela Salisbury MD   On: 06/23/2020 05:14   DG Chest Portable 1 View  Result Date: 07/11/2020 CLINICAL DATA:  Intubated EXAM: PORTABLE CHEST 1 VIEW COMPARISON:  06/27/2020, 06/26/2020, 07/17/2020 FINDINGS: Interval intubation, tip of the endotracheal tube is about a cm superior to the carina. Esophageal tube tip below the diaphragm but incompletely visualized. Perihilar consolidations and airspace  disease without significant change. Probable right pleural effusion. Basilar airspace disease. Stable cardiomediastinal silhouette. IMPRESSION: Interval intubation with tip of the endotracheal tube about a cm superior to the carina. No significant interval change in bilateral perihilar and basilar airspace disease which may reflect edema or pneumonia. Electronically Signed   By: Donavan Foil M.D.   On:  20:30   DG Chest Portable 1 View  Result Date: 06/28/2020 CLINICAL DATA:  65 year old female with decreased O2 saturation. EXAM: PORTABLE CHEST 1 VIEW COMPARISON:  Chest radiograph dated 07/03/2020. FINDINGS: Cardiomegaly with vascular congestion and edema. Pneumonia is not excluded clinical correlation is recommended. Overall progression of pulmonary opacity on the right side. Probable small right pleural effusion. No pneumothorax. No acute osseous pathology. IMPRESSION: Cardiomegaly with findings of  CHF. Pneumonia is not excluded. Electronically Signed   By: Anner Crete M.D.   On: 06/23/2020 20:06   ECHOCARDIOGRAM COMPLETE  Result Date: 06/19/2020    ECHOCARDIOGRAM REPORT   Patient Name:   Stephanie Casey Date of Exam: 06/20/2020 Medical Rec #:  [id number]             Height:       61.0 in Accession #:    [id number]            Weight:       145.8 lb Date of Birth:  1956-04-09            BSA:          1.651 m Patient Age:    65 years              BP:           113/96 mmHg Patient Gender: F                     HR:           99 bpm. Exam Location:  [facility] Procedure: 2D Echo, Color Doppler and Cardiac Doppler Indications:     I50.9 Congestive Heart Failure  History:         Patient has no prior history of Echocardiogram examinations.                  Risk Factors:Hypertension, Diabetes and Dyslipidemia.  Sonographer:     Charmayne Sheer RDCS (AE) Referring Phys:  [id number] Rhetta Mura Diagnosing Phys: Nelva Bush MD  Sonographer Comments: Image acquisition challenging due to patient  behavioral factors., Image acquisition challenging due to uncooperative patient and Image acquisition challenging due to respiratory motion. IMPRESSIONS  1. Left ventricular ejection fraction, by estimation, is 40 to 45%. The left ventricle has mildly decreased function. The left ventricle demonstrates global hypokinesis. The left ventricular internal cavity size was mildly dilated. There is mild left ventricular hypertrophy. Left ventricular diastolic parameters are indeterminate.  2. Right ventricular systolic function is normal. The right ventricular size is mildly enlarged. Mildly increased right ventricular wall thickness. There is moderately elevated pulmonary artery systolic pressure.  3. Left atrial size was mild to moderately dilated.  4. Right atrial size was moderately dilated.  5. A small pericardial effusion is present. The pericardial effusion is circumferential.  6. The mitral valve is abnormal. Mild to moderate mitral valve regurgitation. No evidence of mitral stenosis.  7. The tricuspid valve is degenerative. Tricuspid valve regurgitation is severe.  8. The aortic valve has an indeterminant number of cusps. There is mild thickening of the aortic valve. Aortic valve regurgitation is not visualized. No aortic stenosis is present.  9. The inferior vena cava is dilated in size with <50% respiratory variability, suggesting right atrial pressure of 15 mmHg. FINDINGS  Left Ventricle: Left ventricular ejection fraction, by estimation, is 40 to 45%. The left ventricle has mildly decreased function. The left ventricle demonstrates global hypokinesis. The left ventricular internal cavity size was mildly dilated. There is  mild left ventricular hypertrophy. Left ventricular diastolic parameters are indeterminate. Right Ventricle: The right ventricular size is mildly enlarged. Mildly increased right ventricular wall thickness. Right ventricular systolic function is normal. There is moderately elevated pulmonary  artery systolic pressure. The tricuspid regurgitant velocity is 3.09 m/s, and with an assumed right atrial pressure of 15 mmHg, the estimated right ventricular systolic pressure is 94.8 mmHg. Left Atrium:  Left atrial size was mild to moderately dilated. Right Atrium: Right atrial size was moderately dilated. Pericardium: A small pericardial effusion is present. The pericardial effusion is circumferential. Mitral Valve: The mitral valve is abnormal. There is mild thickening of the mitral valve leaflet(s). Mild to moderate mitral valve regurgitation. No evidence of mitral valve stenosis. MV peak gradient, 6.2 mmHg. The mean mitral valve gradient is 4.0 mmHg. Tricuspid Valve: The tricuspid valve is degenerative in appearance. Tricuspid valve regurgitation is severe. Aortic Valve: The aortic valve has an indeterminant number of cusps. There is mild thickening of the aortic valve. Aortic valve regurgitation is not visualized. No aortic stenosis is present. Aortic valve mean gradient measures 6.0 mmHg. Aortic valve peak gradient measures 10.9 mmHg. Aortic valve area, by VTI measures 2.36 cm. Pulmonic Valve: The pulmonic valve was grossly normal. Pulmonic valve regurgitation is trivial. No evidence of pulmonic stenosis. Aorta: The aortic root is normal in size and structure. Pulmonary Artery: The pulmonary artery is of normal size. Venous: The inferior vena cava is dilated in size with less than 50% respiratory variability, suggesting right atrial pressure of 15 mmHg. IAS/Shunts: The interatrial septum was not well visualized. Additional Comments: There is pleural effusion in the left lateral region.  LEFT VENTRICLE PLAX 2D LVIDd:         5.40 cm  Diastology LVIDs:         3.80 cm  LV e' lateral:   8.81 cm/s LV PW:         1.20 cm  LV E/e' lateral: 15.0 LV IVS:        1.18 cm LVOT diam:     2.20 cm LV SV:         71 LV SV Index:   43 LVOT Area:     3.80 cm  RIGHT VENTRICLE RV Basal diam:  4.10 cm LEFT ATRIUM            Index       RIGHT ATRIUM           Index LA diam:      4.80 cm 2.91 cm/m  RA Area:     25.90 cm LA Vol (A4C): 89.7 ml 54.32 ml/m RA Volume:   90.90 ml  55.05 ml/m  AORTIC VALVE                    PULMONIC VALVE AV Area (Vmax):    2.26 cm     PV Vmax:       1.26 m/s AV Area (Vmean):   2.11 cm     PV Vmean:      79.200 cm/s AV Area (VTI):     2.36 cm     PV VTI:        0.202 m AV Vmax:           165.00 cm/s  PV Peak grad:  6.4 mmHg AV Vmean:          114.000 cm/s PV Mean grad:  3.0 mmHg AV VTI:            0.300 m AV Peak Grad:      10.9 mmHg AV Mean Grad:      6.0 mmHg LVOT Vmax:         97.90 cm/s LVOT Vmean:        63.200 cm/s LVOT VTI:          0.186 m LVOT/AV VTI ratio: 0.62  AORTA Ao Root diam: 3.20  cm MITRAL VALVE                TRICUSPID VALVE MV Area (PHT): 6.65 cm     TR Peak grad:   38.2 mmHg MV Peak grad:  6.2 mmHg     TR Vmax:        309.00 cm/s MV Mean grad:  4.0 mmHg MV Vmax:       1.25 m/s     SHUNTS MV Vmean:      91.3 cm/s    Systemic VTI:  0.19 m MV Decel Time: 114 msec     Systemic Diam: 2.20 cm MV E velocity: 132.00 cm/s MV A velocity: 106.00 cm/s MV E/A ratio:  1.25 Nelva Bush MD Electronically signed by Nelva Bush MD Signature Date/Time: 06/19/2020/1:02:18 PM    Final      Medications:   . famotidine (PEPCID) IV Stopped (06/23/20 2150)  . feeding supplement (VITAL AF 1.2 CAL) 45 mL/hr at 06/24/20 0350  . fentaNYL infusion INTRAVENOUS Stopped (06/24/20 0900)  . propofol (DIPRIVAN) infusion Stopped (06/24/20 0900)   . aspirin  81 mg Per Tube Daily  . chlorhexidine gluconate (MEDLINE KIT)  15 mL Mouth Rinse BID  . Chlorhexidine Gluconate Cloth  6 each Topical Q0600  . docusate  100 mg Per Tube BID  . feeding supplement (PROSource TF)  45 mL Per Tube Daily  . furosemide  40 mg Intravenous BID  . heparin  5,000 Units Subcutaneous Q8H  . hydrALAZINE  25 mg Per Tube BID  . insulin aspart  0-15 Units Subcutaneous Q4H  . ipratropium-albuterol  3 mL Nebulization Once  .  mouth rinse  15 mL Mouth Rinse 10 times per day  . multivitamin  15 mL Per Tube Daily  . polyethylene glycol  17 g Per Tube Daily  . Racepinephrine HCl       acetaminophen **OR** acetaminophen, fentaNYL, haloperidol lactate, ipratropium-albuterol, labetalol, LORazepam, ondansetron (ZOFRAN) IV, vecuronium  Assessment/ Plan:  Stephanie Casey is a 17 y.o. 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   1. 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  Followed by [facility].  Completed two dialysis treatments. Third treatment for later today. Orders prepared.  - Continue IV furosemide.   2. Acute respiratory failure with pulmonary edema requring intubation and mechanical ventilation.  Appreciate critical care input   3. Anemia with chronic kidney disease: hemoglobin 7.8 - EPO ordered   LOS: 3 Jaydon Avina 1/6/202211:29 AM

## 2020-06-24 NOTE — Progress Notes (Signed)
Patient self extubated during rounds.  MD at bedside order for Bipap.  Patient is able to follow commands.

## 2020-06-25 DIAGNOSIS — Z515 Encounter for palliative care: Secondary | ICD-10-CM

## 2020-06-25 DIAGNOSIS — Z7189 Other specified counseling: Secondary | ICD-10-CM

## 2020-06-25 DIAGNOSIS — Z66 Do not resuscitate: Secondary | ICD-10-CM

## 2020-06-25 LAB — BASIC METABOLIC PANEL
Anion gap: 12 (ref 5–15)
BUN: 37 mg/dL — ABNORMAL HIGH (ref 8–23)
CO2: 26 mmol/L (ref 22–32)
Calcium: 8 mg/dL — ABNORMAL LOW (ref 8.9–10.3)
Chloride: 102 mmol/L (ref 98–111)
Creatinine, Ser: 2.62 mg/dL — ABNORMAL HIGH (ref 0.44–1.00)
GFR, Estimated: 20 mL/min — ABNORMAL LOW (ref >60)
Glucose, Bld: 177 mg/dL — ABNORMAL HIGH (ref 70–99)
Potassium: 3.7 mmol/L (ref 3.5–5.1)
Sodium: 140 mmol/L (ref 135–145)

## 2020-06-25 LAB — CBC
HCT: 25.9 % — ABNORMAL LOW (ref 36.0–46.0)
Hemoglobin: 8.2 g/dL — ABNORMAL LOW (ref 12.0–15.0)
MCH: 30.4 pg (ref 26.0–34.0)
MCHC: 31.7 g/dL (ref 30.0–36.0)
MCV: 95.9 fL (ref 80.0–100.0)
Platelets: 277 10*3/uL (ref 150–400)
RBC: 2.7 MIL/uL — ABNORMAL LOW (ref 3.87–5.11)
RDW: 14.3 % (ref 11.5–15.5)
WBC: 18.3 10*3/uL — ABNORMAL HIGH (ref 4.0–10.5)
nRBC: 0 % (ref 0.0–0.2)

## 2020-06-25 LAB — GLUCOSE, CAPILLARY
Glucose-Capillary: 213 mg/dL — ABNORMAL HIGH (ref 70–99)
Glucose-Capillary: 158 mg/dL — ABNORMAL HIGH (ref 70–99)
Glucose-Capillary: 116 mg/dL — ABNORMAL HIGH (ref 70–99)
Glucose-Capillary: 143 mg/dL — ABNORMAL HIGH (ref 70–99)
Glucose-Capillary: 113 mg/dL — ABNORMAL HIGH (ref 70–99)
Glucose-Capillary: 140 mg/dL — ABNORMAL HIGH (ref 70–99)

## 2020-06-25 LAB — MAGNESIUM: Magnesium: 2.1 mg/dL (ref 1.7–2.4)

## 2020-06-25 LAB — TRIGLYCERIDES: Triglycerides: 119 mg/dL (ref <150)

## 2020-06-25 LAB — PHOSPHORUS: Phosphorus: 3.5 mg/dL (ref 2.5–4.6)

## 2020-06-25 MED ORDER — INSULIN ASPART 100 UNIT/ML ~~LOC~~ SOLN
SUBCUTANEOUS | Status: AC
  Filled 2020-06-25: qty 1

## 2020-06-25 MED ORDER — INSULIN ASPART 100 UNIT/ML ~~LOC~~ SOLN
0.0000 [IU] | SUBCUTANEOUS | Status: DC
  Administered 2020-06-25: 3 [IU] via SUBCUTANEOUS
  Administered 2020-06-25: 7 [IU] via SUBCUTANEOUS
  Administered 2020-06-25: 3 [IU] via SUBCUTANEOUS
  Administered 2020-06-25 – 2020-06-26 (×2): 4 [IU] via SUBCUTANEOUS
  Administered 2020-06-26: 3 [IU] via SUBCUTANEOUS
  Administered 2020-06-26: 4 [IU] via SUBCUTANEOUS
  Administered 2020-06-26 (×2): 3 [IU] via SUBCUTANEOUS
  Administered 2020-06-26: 7 [IU] via SUBCUTANEOUS
  Administered 2020-06-27 – 2020-06-28 (×9): 4 [IU] via SUBCUTANEOUS
  Administered 2020-06-28: 7 [IU] via SUBCUTANEOUS
  Administered 2020-06-28: 4 [IU] via SUBCUTANEOUS
  Administered 2020-06-29: 3 [IU] via SUBCUTANEOUS
  Administered 2020-06-29 (×2): 4 [IU] via SUBCUTANEOUS
  Administered 2020-06-29 (×2): 3 [IU] via SUBCUTANEOUS
  Administered 2020-06-29: 4 [IU] via SUBCUTANEOUS
  Administered 2020-06-29: 3 [IU] via SUBCUTANEOUS
  Administered 2020-06-30: 4 [IU] via SUBCUTANEOUS
  Administered 2020-06-30: 3 [IU] via SUBCUTANEOUS
  Filled 2020-06-25 (×27): qty 1

## 2020-06-25 NOTE — Progress Notes (Signed)
[facility]  ROUNDING NOTE   Subjective:   Third hemodialysis treatment yesterday. UF of 1 liter. UOP 422m.   Extubated herself yesterday. [name].   Objective:  Vital signs in last 24 hours:  Temp:  [97 F (36.1 C)-99.1 F (37.3 C)] 98.78 F (37.1 C) (01/07 0400) Pulse Rate:  [72-98] 77 (01/07 0400) Resp:  [11-18] 16 (01/07 0400) BP: (118-147)/(61-74) 118/64 (01/07 1006) SpO2:  [90 %-100 %] 97 % (01/07 0400) FiO2 (%):  [40 %-50 %] 40 % (01/07 0740) Weight:  [57.2 kg] 57.2 kg (01/07 0325)  Weight change: -2.4 kg Filed Weights   07/09/2020 2009 06/24/20 0116 06/25/20 0325  Weight: 74.3 kg 59.6 kg 57.2 kg    Intake/Output: I/O last 3 completed shifts: In: 2078.1 [I.V.:978.3; NG/GT:999.8; IV Piggyback:100] Out: 1510 [Urine:510; Other:1000]   Intake/Output this shift:  No intake/output data recorded.  Physical Exam: General: Critically ill  Head: ETT  Eyes: Anicteric, PERRL  Neck: Supple, trachea midline  Lungs:  Bilateral crackles, PRVC 40%  Heart: Regular rate and rhythm  Abdomen:  Soft, obese  Extremities: + peripheral edema.  Neurologic: Intubated and sedated  Skin: No lesions  Access: Right femoral temp HD catheter 1/4 Dr. SDelana Meyer   Basic Metabolic Panel: Recent Labs  Lab 07/17/2020 1454 06/27/2020 1843 07/06/2020 0223 06/23/20 0559 06/24/20 0326 06/25/20 0629  NA 133*  --  134* 138 137 140  K 4.9  --  4.9 4.4 3.9 3.7  CL 99  --  97* 102 100 102  CO2 23  --  24 20* 24 26  GLUCOSE 146*  --  207* 96 206* 177*  BUN 78*  --  80* 63* 46* 37*  CREATININE 6.46*  --  6.60* 5.44* 3.68* 2.62*  CALCIUM 8.4*  --  8.6* 8.3* 7.9* 8.0*  MG  --  3.0* 2.9*  --  2.2 2.1  PHOS  --   --  6.0*  --  5.2* 3.5    Liver Function Tests: Recent Labs  Lab 06/26/2020 1454 07/16/2020 0223  AST 31 28  ALT 38 37  ALKPHOS 121 131*  BILITOT 0.8 0.9  PROT 6.9 7.3  ALBUMIN 3.3* 3.4*   No results for input(s): LIPASE, AMYLASE in the last 168  hours. No results for input(s): AMMONIA in the last 168 hours.  CBC: Recent Labs  Lab 06/27/2020 0223 06/29/2020 2130 06/23/20 0559 06/24/20 0326 06/25/20 0629  WBC 12.4* 5.7 14.1* 15.9* 18.3*  NEUTROABS 11.4*  --   --   --   --   HGB 10.1* 8.2* 8.6* 7.8* 8.2*  HCT 30.6* 25.0* 27.4* 24.1* 25.9*  MCV 93.9 94.3 97.9 94.9 95.9  PLT 399 269 278 253 277    Cardiac Enzymes: No results for input(s): CKTOTAL, CKMB, CKMBINDEX, TROPONINI in the last 168 hours.  BNP: Invalid input(s): POCBNP  CBG: Recent Labs  Lab 06/24/20 1618 06/24/20 1924 06/24/20 2322 06/25/20 0318 06/25/20 0737  GLUCAP 167* 149* 201* 213* 158*    Microbiology: Results for orders placed or performed during the hospital encounter of 07/03/2020  Resp Panel by RT-PCR (Flu A&B, Covid) Nasopharyngeal Swab     Status: None   Collection Time: 07/07/2020  6:43 PM   Specimen: Nasopharyngeal Swab; Nasopharyngeal(NP) swabs in vial transport medium  Result Value Ref Range Status   SARS Coronavirus 2 by RT PCR NEGATIVE NEGATIVE Final    Comment: (NOTE) SARS-CoV-2 target nucleic acids are NOT DETECTED.  The SARS-CoV-2 RNA  is generally detectable in upper respiratory specimens during the acute phase of infection. The lowest concentration of SARS-CoV-2 viral copies this assay can detect is 138 copies/mL. A negative result does not preclude SARS-Cov-2 infection and should not be used as the sole basis for treatment or other patient management decisions. A negative result may occur with  improper specimen collection/handling, submission of specimen other than nasopharyngeal swab, presence of viral mutation(s) within the areas targeted by this assay, and inadequate number of viral copies(<138 copies/mL). A negative result must be combined with clinical observations, patient history, and epidemiological information. The expected result is Negative.  Fact Sheet for Patients:  EntrepreneurPulse.com.au  Fact  Sheet for Healthcare Providers:  IncredibleEmployment.be  This test is no t yet approved or cleared by the [place] FDA and  has been authorized for detection and/or diagnosis of SARS-CoV-2 by FDA under an Emergency Use Authorization (EUA). This EUA will remain  in effect (meaning this test can be used) for the duration of the COVID-19 declaration under Section 564(b)(1) of the Act, 21 U.S.C.section 360bbb-3(b)(1), unless the authorization is terminated  or revoked sooner.       Influenza A by PCR NEGATIVE NEGATIVE Final   Influenza B by PCR NEGATIVE NEGATIVE Final    Comment: (NOTE) The Xpert Xpress SARS-CoV-2/FLU/RSV plus assay is intended as an aid in the diagnosis of influenza from Nasopharyngeal swab specimens and should not be used as a sole basis for treatment. Nasal washings and aspirates are unacceptable for Xpert Xpress SARS-CoV-2/FLU/RSV testing.  Fact Sheet for Patients: EntrepreneurPulse.com.au  Fact Sheet for Healthcare Providers: IncredibleEmployment.be  This test is not yet approved or cleared by the [place] FDA and has been authorized for detection and/or diagnosis of SARS-CoV-2 by FDA under an Emergency Use Authorization (EUA). This EUA will remain in effect (meaning this test can be used) for the duration of the COVID-19 declaration under Section 564(b)(1) of the Act, 21 U.S.C. section 360bbb-3(b)(1), unless the authorization is terminated or revoked.  Performed at [facility], [address] [place], [place] [address]   Blood Culture (routine x 2)     Status: None (Preliminary result)   Collection Time: 06/26/2020  6:43 PM   Specimen: BLOOD  Result Value Ref Range Status   Specimen Description BLOOD RIGHT ANTECUBITAL  Final   Special Requests   Final    BOTTLES DRAWN AEROBIC ONLY Blood Culture adequate volume   Culture   Final    NO GROWTH 4 DAYS Performed at [facility], [address], [place], [place] [address]    Report Status PENDING  Incomplete  Blood Culture (routine x 2)     Status: None (Preliminary result)   Collection Time: 07/16/2020  6:43 PM   Specimen: BLOOD  Result Value Ref Range Status   Specimen Description BLOOD LEFT ANTECUBITAL  Final   Special Requests   Final    BOTTLES DRAWN AEROBIC ONLY Blood Culture adequate volume   Culture   Final    NO GROWTH 4 DAYS Performed at [facility], [address], [place], [place] [address]    Report Status PENDING  Incomplete  Resp Panel by RT-PCR (Flu A&B, Covid) Nasopharyngeal Swab     Status: None   Collection Time: 06/19/2020  9:39 PM   Specimen: Nasopharyngeal Swab; Nasopharyngeal(NP) swabs in vial transport medium  Result Value Ref Range Status   SARS Coronavirus 2 by RT PCR NEGATIVE NEGATIVE Final    Comment: (NOTE) SARS-CoV-2  target nucleic acids are NOT DETECTED.  The SARS-CoV-2 RNA is generally detectable in upper respiratory specimens during the acute phase of infection. The lowest concentration of SARS-CoV-2 viral copies this assay can detect is 138 copies/mL. A negative result does not preclude SARS-Cov-2 infection and should not be used as the sole basis for treatment or other patient management decisions. A negative result may occur with  improper specimen collection/handling, submission of specimen other than nasopharyngeal swab, presence of viral mutation(s) within the areas targeted by this assay, and inadequate number of viral copies(<138 copies/mL). A negative result must be combined with clinical observations, patient history, and epidemiological information. The expected result is Negative.  Fact Sheet for Patients:  EntrepreneurPulse.com.au  Fact Sheet for Healthcare Providers:  IncredibleEmployment.be  This test is no t yet approved or cleared by the [place] FDA and  has been authorized for detection and/or  diagnosis of SARS-CoV-2 by FDA under an Emergency Use Authorization (EUA). This EUA will remain  in effect (meaning this test can be used) for the duration of the COVID-19 declaration under Section 564(b)(1) of the Act, 21 U.S.C.section 360bbb-3(b)(1), unless the authorization is terminated  or revoked sooner.       Influenza A by PCR NEGATIVE NEGATIVE Final   Influenza B by PCR NEGATIVE NEGATIVE Final    Comment: (NOTE) The Xpert Xpress SARS-CoV-2/FLU/RSV plus assay is intended as an aid in the diagnosis of influenza from Nasopharyngeal swab specimens and should not be used as a sole basis for treatment. Nasal washings and aspirates are unacceptable for Xpert Xpress SARS-CoV-2/FLU/RSV testing.  Fact Sheet for Patients: EntrepreneurPulse.com.au  Fact Sheet for Healthcare Providers: IncredibleEmployment.be  This test is not yet approved or cleared by the [place] FDA and has been authorized for detection and/or diagnosis of SARS-CoV-2 by FDA under an Emergency Use Authorization (EUA). This EUA will remain in effect (meaning this test can be used) for the duration of the COVID-19 declaration under Section 564(b)(1) of the Act, 21 U.S.C. section 360bbb-3(b)(1), unless the authorization is terminated or revoked.  Performed at [facility], [address] [place], [place] [address]   MRSA PCR Screening     Status: None   Collection Time: 06/23/20  2:45 AM   Specimen: Nasopharyngeal  Result Value Ref Range Status   MRSA by PCR NEGATIVE NEGATIVE Final    Comment:        The GeneXpert MRSA Assay (FDA approved for NASAL specimens only), is one component of a comprehensive MRSA colonization surveillance program. It is not intended to diagnose MRSA infection nor to guide or monitor treatment for MRSA infections. Performed at [facility], [address] [place], [place] [address]     Coagulation Studies: No  results for input(s): LABPROT, INR in the last 72 hours.  Urinalysis: No results for input(s): COLORURINE, LABSPEC, PHURINE, GLUCOSEU, HGBUR, BILIRUBINUR, KETONESUR, PROTEINUR, UROBILINOGEN, NITRITE, LEUKOCYTESUR in the last 72 hours.  Invalid input(s): APPERANCEUR    Imaging: DG Abd 1 View  Result Date: 06/24/2020 CLINICAL DATA:  Gastric tube placement EXAM: ABDOMEN - 1 VIEW COMPARISON:  07/10/2020 FINDINGS: Gastric tube in the body of the stomach unchanged in position from the prior study. Normal bowel gas pattern Right femoral vascular catheter overlying the L4-5 level. IMPRESSION: Gastric tube in the body the stomach unchanged. Normal bowel gas pattern. Electronically Signed   By: Franchot Gallo M.D.   On: 06/24/2020 12:45   CT HEAD WO CONTRAST  Result Date: 06/23/2020 CLINICAL DATA:  Acute hypoxic respiratory failure, acute renal insufficiency, altered level of consciousness, intubated EXAM: CT HEAD WITHOUT CONTRAST TECHNIQUE: Contiguous axial images were obtained from the base of the skull through the vertex without intravenous contrast. COMPARISON:  06/26/2010 FINDINGS: Brain: No acute infarct or hemorrhage. Lateral ventricles and midline structures are unremarkable. No acute extra-axial fluid collections. No mass effect. Vascular: Diffuse atherosclerosis of the internal carotid arteries. No hyperdense vessel. Skull: Normal. Negative for fracture or focal lesion. Sinuses/Orbits: No acute finding. Other: None. IMPRESSION: 1. No acute intracranial process. Electronically Signed   By: Randa Ngo M.D.   On: 06/23/2020 21:53   DG Chest Port 1 View  Result Date: 06/24/2020 CLINICAL DATA:  Endotracheal tube.  Respiratory failure EXAM: PORTABLE CHEST 1 VIEW COMPARISON:  06/24/2020 FINDINGS: Endotracheal tube tip approximately 2 cm above the carina. Gastric tube extends into the stomach. Diffuse bilateral airspace disease. Mild improvement on the right. No change on the left. This is most prominent  in the perihilar and lower lung zones. Probable small left effusion unchanged. IMPRESSION: Endotracheal tube in satisfactory position Diffuse bilateral airspace disease with mild improvement on the right. Electronically Signed   By: Franchot Gallo M.D.   On: 06/24/2020 12:48   DG Chest Port 1 View  Result Date: 06/24/2020 CLINICAL DATA:  Hypoxia EXAM: PORTABLE CHEST 1 VIEW COMPARISON:  June 23, 2020. FINDINGS: Endotracheal tube tip is 3.5 cm above the carina. Nasogastric tube tip and side port are below the diaphragm. No pneumothorax. There has been significant clearing of airspace opacity from the left upper lobe and mid lung region with mild residual opacity in the left upper and mid lung regions remaining. Airspace opacity has partially but incompletely cleared from the right upper lobe. There is fairly extensive airspace opacity in the right mid and lower lung regions. There is atelectatic change in the left lower lobe, slightly increased. Heart is upper normal in size with pulmonary vascularity normal. No adenopathy. There is aortic atherosclerosis. No bone lesions. IMPRESSION: Tube positions as described without pneumothorax. Multifocal airspace opacity with areas of partial clearing, particularly in the right upper lobe and left upper lobe and mid lung regions. More extensive opacity remains in the right mid lung and right base regions. Stable cardiac silhouette. Aortic Atherosclerosis (ICD10-I70.0). Electronically Signed   By: Lowella Grip III M.D.   On: 06/24/2020 10:32     Medications:   . famotidine (PEPCID) IV Stopped (06/24/20 2150)  . feeding supplement (VITAL AF 1.2 CAL) 45 mL/hr at 06/24/20 0350  . fentaNYL infusion INTRAVENOUS 200 mcg/hr (06/25/20 0400)  . propofol (DIPRIVAN) infusion 35 mcg/kg/min (06/25/20 0647)   . aspirin  81 mg Per Tube Daily  . chlorhexidine gluconate (MEDLINE KIT)  15 mL Mouth Rinse BID  . Chlorhexidine Gluconate Cloth  6 each Topical Q0600  . docusate   100 mg Per Tube BID  . [START ON 06/26/2020] epoetin (EPOGEN/PROCRIT) injection  10,000 Units Intravenous Q T,Th,Sa-HD  . feeding supplement (PROSource TF)  45 mL Per Tube Daily  . furosemide  40 mg Intravenous BID  . heparin  5,000 Units Subcutaneous Q8H  . hydrALAZINE  25 mg Per Tube BID  . insulin aspart  0-20 Units Subcutaneous Q4H  . mouth rinse  15 mL Mouth Rinse 10 times per day  . multivitamin  15 mL Per Tube Daily  . polyethylene glycol  17 g Per Tube Daily   acetaminophen **OR** acetaminophen, fentaNYL, haloperidol lactate, ipratropium-albuterol, labetalol, LORazepam, ondansetron (ZOFRAN) IV, vecuronium  Assessment/ Plan:  Ms. Stephanie Casey is a 65 y.o. 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   1. 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  Followed by [facility]. Outpatient planning in process Completed Third dialysis treatments. Evaluate daily for dialysis need - Continue IV furosemide.   2. Acute respiratory failure with pulmonary edema requring intubation and mechanical ventilation.  Appreciate critical care input.   3. Anemia with chronic kidney disease: hemoglobin 8.2 - EPO ordered   LOS: 4 Stephanie Casey 1/7/202211:10 AM

## 2020-06-25 NOTE — Progress Notes (Signed)
06/24/20 1555 06/24/20 1600 06/24/20 1615  Vitals  Temp 98.4 F (36.9 C) 98.4 F (36.9 C) 98.4 F (36.9 C)  BP 128/67 127/66 128/66  MAP (mmHg) 87 85 85  BP Location Right Arm  --  Right Arm  BP Method Automatic  --  Automatic  Patient Position (if appropriate) Lying  --  Lying  Pulse Rate 77 74 72  Pulse Rate Source Monitor  --  Monitor  ECG Heart Rate  --  74 72  Resp 16 16 16   During Hemodialysis Assessment  Blood Flow Rate (mL/min) 300 mL/min 300 mL/min 300 mL/min  Arterial Pressure (mmHg) -120 mmHg -130 mmHg -130 mmHg  Venous Pressure (mmHg) 100 mmHg 120 mmHg 120 mmHg  Transmembrane Pressure (mmHg) 70 mmHg 60 mmHg 60 mmHg  Ultrafiltration Rate (mL/min) 500 mL/min 500 mL/min 500 mL/min  Dialysate Flow Rate (mL/min) 600 ml/min 600 ml/min 600 ml/min  Conductivity: Machine  14.3 14.3 14.3  HD Safety Checks Performed Yes Yes Yes  Dialysis Fluid Bolus Normal Saline  --   --   Bolus Amount (mL) 300 mL  --   --   Intra-Hemodialysis Comments Tx initiated Progressing as prescribed Progressing as prescribed    06/24/20 1630 06/24/20 1645 06/24/20 1700  Vitals  Temp 98.2 F (36.8 C) 98.1 F (36.7 C) 97.7 F (36.5 C)  BP 132/68 127/68 130/67  MAP (mmHg) 88 85 87  BP Location Right Arm Right Arm Right Arm  BP Method Automatic Automatic Automatic  Patient Position (if appropriate) Lying Lying Lying  Pulse Rate 74 73 77  Pulse Rate Source Monitor Monitor Monitor  ECG Heart Rate 73 73 78  Resp 16 16 18   During Hemodialysis Assessment  Blood Flow Rate (mL/min) 300 mL/min 300 mL/min 300 mL/min  Arterial Pressure (mmHg) -120 mmHg -130 mmHg -130 mmHg  Venous Pressure (mmHg) 120 mmHg 120 mmHg 120 mmHg  Transmembrane Pressure (mmHg) 60 mmHg 60 mmHg 60 mmHg  Ultrafiltration Rate (mL/min) 500 mL/min 500 mL/min 500 mL/min  Dialysate Flow Rate (mL/min) 600 ml/min 600 ml/min 600 ml/min  Conductivity: Machine  14.3 14.3 14.3  HD Safety Checks Performed Yes Yes Yes  Dialysis Fluid Bolus   --   --   --   Bolus Amount (mL)  --   --   --   Intra-Hemodialysis Comments Progressing as prescribed Progressing as prescribed Progressing as prescribed    06/24/20 1715 06/24/20 1730 06/24/20 1745  Vitals  Temp 97.7 F (36.5 C) 97.7 F (36.5 C) 97.7 F (36.5 C)  BP 126/68 118/66 124/66  MAP (mmHg) 87 82 84  BP Location Right Arm Right Arm Right Arm  BP Method Automatic Automatic Automatic  Patient Position (if appropriate) Lying Lying Lying  Pulse Rate 78 76 76  Pulse Rate Source Monitor Monitor Monitor  ECG Heart Rate 77 76 77  Resp 17 16 16   During Hemodialysis Assessment  Blood Flow Rate (mL/min) 300 mL/min 300 mL/min 300 mL/min  Arterial Pressure (mmHg) -130 mmHg -130 mmHg -130 mmHg  Venous Pressure (mmHg) 130 mmHg 120 mmHg 120 mmHg  Transmembrane Pressure (mmHg) 60 mmHg 50 mmHg 50 mmHg  Ultrafiltration Rate (mL/min) 500 mL/min 500 mL/min 500 mL/min  Dialysate Flow Rate (mL/min) 600 ml/min 600 ml/min 600 ml/min  Conductivity: Machine  14.3 14.3 14.3  HD Safety Checks Performed Yes Yes Yes  Dialysis Fluid Bolus  --   --   --   Bolus Amount (mL)  --   --   --  Intra-Hemodialysis Comments Progressing as prescribed Progressing as prescribed Progressing as prescribed

## 2020-06-25 NOTE — Consult Note (Cosign Needed)
Consultation Note Date: 06/25/2020   Patient Name: Stephanie Casey  DOB: [birth year]  MRN: [id number]  Age / Sex: 65 y.o., female  PCP: Stephanie Finner, NP Referring Physician: Flora Lipps, MD  Reason for Consultation: Establishing goals of care  HPI/Patient Profile: 65 y.o. 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 She failed trial of bipap and required intubation. She self-extubated 1/6 but required reintubation.  PMT consulted to discuss Stephanie Casey.  Clinical Assessment and Goals of Care: I have reviewed medical records including EPIC notes, labs and imaging, received report from RN, assessed the patient and then met with patient's spouse Stephanie Casey to discuss diagnosis prognosis, GOC, EOL wishes, disposition and options.  Interpreter services used to communicate with Stephanie Casey.   I introduced Palliative Medicine as specialized medical care for people living with serious illness. It focuses on providing relief from the symptoms and stress of a serious illness. The goal is to improve quality of life for both the patient and the family.  Stephanie Casey tells me about patient's hospitalization at [facility]. He tells me they were aware of her renal failure. Tells me patient was hospitalized about 15 days and then did well at home for 2 months. He tells me they received a natural medication from [place] to help her kidneys but after she took it, she began to worsen and then required hospitalization.    We discussed patient's current illness and what it means in the larger context of patient's on-going co-morbidities.  Natural disease trajectory and expectations at EOL were discussed. We discussed her kidney, heart, and respiratory failure.  Stephanie Casey expresses understanding. Stephanie Casey tells me he does not think the patient will come home.   I attempted to elicit values and goals of care important to the patient.  Stephanie Casey tells me that he and Stephanie Casey had spoken about her wishes if she were very ill. Patient was aware of her severe illness. He tells me of his plans to bury her in [place] where her family is.   The difference between aggressive medical intervention and comfort care was considered in light of the patient's goals of care. We discussed code status. Encouraged Stephanie Casey to consider DNR/DNI status understanding evidenced based poor outcomes in similar hospitalized patients, as the cause of the arrest is likely associated with chronic/terminal disease rather than a reversible acute cardio-pulmonary event. Stephanie Casey agrees that DNR is appropriate - he does not want anything to cause additional suffering.   We discussed Stephanie Casey's care in the coming days. Stephanie Casey is certain that he does not want to prolong suffering for Stephanie Casey. He would like to continue current care for the next few days and see how Stephanie Casey responds - specifically mentions Sunday. If Stephanie Casey worsens or shows no signs of improvement, Stephanie Casey would be open to discussing comfort measures. He does share that he would not want to pursue a trach for Stephanie Casey.   Discussed with Stephanie Casey the importance of continued conversation with family and the medical providers regarding overall plan of care and treatment options, ensuring decisions are within the context of the patient's values and GOCs.  Stephanie Casey tells me he will be at the bedside every day to hear how Stephanie Casey is doing.   Questions and concerns were addressed. The family was encouraged to call with questions or concerns.   Primary  Decision Maker NEXT OF KIN - spouse Stephanie Casey    SUMMARY OF RECOMMENDATIONS   Code status change to DNR Continue current measures though Stephanie Casey does not want to pursue trach  Stephanie Casey plans to be at bedside daily for update If Stephanie Casey worsens  or does not improve Stephanie Casey is open to discussed comfort measures for Stephanie Casey  Code Status/Advance Care Planning:  DNR  Prognosis:   Unable to determine  Discharge Planning: To Be Determined      Primary Diagnoses: Present on Admission: . Acute respiratory failure with hypoxia ([place]) . Acute heart failure with preserved ejection fraction (HFpEF) ([place]) . Hypertension . ARF (acute renal failure) ([place]) . SOB (shortness of breath) . Elevated troponin . Acute renal failure (ARF) ([place])   I have reviewed the medical record, interviewed the patient and family, and examined the patient. The following aspects are pertinent.  Past Medical History:  Diagnosis Date  . Diabetes mellitus without complication ([place])   . 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 Hyperlipidemia   . Hypertension    Social History   Socioeconomic History  . Marital status: Married    Spouse name: Not on file  . Number of children: Not on file  . Years of education: Not on file  . Highest education level: Not on file  Occupational History  . Not on file  Tobacco Use  . Smoking status: Never Smoker  . Smokeless tobacco: Never Used  Substance and Sexual Activity  . Alcohol use: Never  . Drug use: Never  . Sexual activity: Not on file  Other Topics Concern  . Not on file  Social History Narrative  . Not on file   Social Determinants of Health   Financial Resource Strain: Not on file  Food Insecurity: Not on file  Transportation Needs: Not on file  Physical Activity: Not on file  Stress: Not on file  Social Connections: Not on file   History reviewed. No pertinent family history. Scheduled Meds: . aspirin  81 mg Per Tube Daily  . chlorhexidine gluconate (MEDLINE KIT)  15 mL Mouth Rinse BID  . Chlorhexidine Gluconate Cloth  6 each Topical Q0600  . docusate  100 mg Per Tube BID  . [START ON 06/26/2020] epoetin (EPOGEN/PROCRIT) injection  10,000 Units Intravenous Q T,Th,Sa-HD  .  feeding supplement (PROSource TF)  45 mL Per Tube Daily  . furosemide  40 mg Intravenous BID  . heparin  5,000 Units Subcutaneous Q8H  . hydrALAZINE  25 mg Per Tube BID  . insulin aspart  0-20 Units Subcutaneous Q4H  . mouth rinse  15 mL Mouth Rinse 10 times per day  . multivitamin  15 mL Per Tube Daily  . polyethylene glycol  17 g Per Tube Daily   Continuous Infusions: . famotidine (PEPCID) IV Stopped (06/24/20 2150)  . feeding supplement (VITAL AF 1.2 CAL) 45 mL/hr at 06/24/20 0350  . fentaNYL infusion INTRAVENOUS 200 mcg/hr (06/25/20 0400)  . propofol (DIPRIVAN) infusion 35 mcg/kg/min (06/25/20 0647)   PRN Meds:.acetaminophen **OR** acetaminophen, fentaNYL, haloperidol lactate, ipratropium-albuterol, labetalol, LORazepam, ondansetron (ZOFRAN) IV, vecuronium Allergies  Allergen Reactions  . Hydrochlorothiazide     Pancreatitis   Review of Systems  Unable to perform ROS: Intubated    Physical Exam Constitutional:      General: She is not in acute distress.    Comments: Sedated and intubated  Skin:    General: Skin is warm and dry.     Vital Signs: BP 125/67  Pulse 74   Temp 98.8 F (37.1 C)   Resp 16   Ht 5' 0.98" (1.549 m)   Wt 57.2 kg   SpO2 94%   BMI 23.84 kg/m  Pain Scale: CPOT   Pain Score: 0-No pain   SpO2: SpO2: 94 % O2 Device:SpO2: 94 % O2 Flow Rate: .O2 Flow Rate (L/min): 15 L/min  IO: Intake/output summary:   Intake/Output Summary (Last 24 hours) at 06/25/2020 1409 Last data filed at 06/25/2020 0400 Gross per 24 hour  Intake 1369.79 ml  Output 1410 ml  Net -40.21 ml    LBM: Last BM Date: 07/14/2020 Baseline Weight: Weight: 68 kg Most recent weight: Weight: 57.2 kg      Time Total: 80 minutes Greater than 50%  of this time was spent counseling and coordinating care related to the above assessment and plan.  Juel Burrow, DNP, AGNP-C Palliative Medicine Team [phone] Pager: [phone]

## 2020-06-25 NOTE — Progress Notes (Signed)
   06/23/20 1700 06/23/20 1715 06/23/20 1730  Vitals  Temp 99.68 F (37.6 C) 99.68 F (37.6 C) 99.68 F (37.6 C)  BP 133/69 136/71 133/69  MAP (mmHg) 88 90 88  Pulse Rate 78 79 79  ECG Heart Rate 78 79 79  Resp 15 17 20   During Hemodialysis Assessment  Blood Flow Rate (mL/min) 250 mL/min 250 mL/min 250 mL/min  Arterial Pressure (mmHg) -100 mmHg -100 mmHg -100 mmHg  Venous Pressure (mmHg) 90 mmHg 90 mmHg 90 mmHg  Transmembrane Pressure (mmHg) 50 mmHg 50 mmHg 50 mmHg  Ultrafiltration Rate (mL/min) 400 mL/min 400 mL/min 400 mL/min  Dialysate Flow Rate (mL/min) 500 ml/min 500 ml/min 500 ml/min  Conductivity: Machine  14.3 14.3 14.3  HD Safety Checks Performed Yes Yes Yes  Intra-Hemodialysis Comments Progressing as prescribed Progressing as prescribed Progressing as prescribed    06/23/20 1745  Vitals  Temp 99.68 F (37.6 C)  BP 133/70  MAP (mmHg) 88  Pulse Rate 79  ECG Heart Rate 79  Resp 15  During Hemodialysis Assessment  Blood Flow Rate (mL/min) 250 mL/min  Arterial Pressure (mmHg) -100 mmHg  Venous Pressure (mmHg) 90 mmHg  Transmembrane Pressure (mmHg) 50 mmHg  Ultrafiltration Rate (mL/min) 400 mL/min  Dialysate Flow Rate (mL/min) 500 ml/min  Conductivity: Machine  14.3  HD Safety Checks Performed Yes  Intra-Hemodialysis Comments Tx completed

## 2020-06-25 NOTE — Progress Notes (Signed)
CRITICAL CARE NOTE 65 y.o. 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 Failed trial of BiPAP in ED requiring emergent intubation.  1/4: Right Femoral Trialysis placed by Vascular Surgery  Significant Diagnostic Tests:  1/3: CXR>>Moderate to marked severity predominantly bilateral perihilar, bilateral suprahilar and bilateral infrahilar infiltrates are seen. Extension to involve the bilateral lung bases is also noted. There are small bilateral pleural effusions. No pneumothorax is identified. The cardiac silhouette is moderately enlarged. The visualized skeletal structures are unremarkable. 1/3: Renal US>>Atrophic and echogenic bilateral kidneys suggesting renal parenchymal disease with an underlying mass within the left kidney not excluded. Recommend MRI or CT renal protocol for further evaluation.  Micro Data:  1/3: SARS-CoV-2 PCR>>negative 1/3: Influenza A&B PCR>>negative 1/3: Blood culture x2>> 1/4: HIV Screen>>nonreactive 1/4: Hepatitis B Surface Ag>>nonreactive 1/4:Hepatitis B S ab>>nonreactive 1/4: Hepatitis B core Ab, IgM>>nonreactive 1/4: Hepatitis C Ab>>nonreactive      1/4 admitted to ICU for acute pulm edema 1/5 remains on vent did NOT tolerate HD 1/6 self extubated-on biPAP 1/6 re intubated emergently   CC  follow up respiratory failure  SUBJECTIVE Patient remains critically ill Prognosis is guarded  Vent Mode: PRVC FiO2 (%):  [40 %-60 %] 40 % Set Rate:  [16 bmp] 16 bmp Vt Set:  [450 mL] 450 mL PEEP:  [5 cmH20] 5 cmH20  CBC    Component Value Date/Time   WBC 18.3 (H) 06/25/2020 0629   RBC 2.70 (L) 06/25/2020 0629   HGB 8.2 (L) 06/25/2020 0629   HGB 12.4 01/15/2013 2354   HCT 25.9 (L) 06/25/2020 0629   HCT 37.6 01/15/2013 2354   PLT 277 06/25/2020 0629   PLT 265 01/15/2013 2354   MCV 95.9 06/25/2020 0629   MCV 85 01/15/2013 2354   MCH 30.4  06/25/2020 0629   MCHC 31.7 06/25/2020 0629   RDW 14.3 06/25/2020 0629   RDW 13.5 01/15/2013 2354   LYMPHSABS 0.5 (L) 07/10/2020 0223   MONOABS 0.4 06/25/2020 0223   EOSABS 0.0 07/18/2020 0223   BASOSABS 0.0 06/20/2020 0223   BMP Latest Ref Rng & Units 06/24/2020 06/23/2020 06/25/2020  Glucose 70 - 99 mg/dL 206(H) 96 207(H)  BUN 8 - 23 mg/dL 46(H) 63(H) 80(H)  Creatinine 0.44 - 1.00 mg/dL 3.68(H) 5.44(H) 6.60(H)  Sodium 135 - 145 mmol/L 137 138 134(L)  Potassium 3.5 - 5.1 mmol/L 3.9 4.4 4.9  Chloride 98 - 111 mmol/L 100 102 97(L)  CO2 22 - 32 mmol/L 24 20(L) 24  Calcium 8.9 - 10.3 mg/dL 7.9(L) 8.3(L) 8.6(L)      BP 130/69 (BP Location: Right Arm)   Pulse 77   Temp 98.78 F (37.1 C) (Esophageal)   Resp 16   Ht 5' 0.98" (1.549 m)   Wt 57.2 kg   SpO2 97%   BMI 23.84 kg/m    I/O last 3 completed shifts: In: 2078.1 [I.V.:978.3; NG/GT:999.8; IV Piggyback:100] Out: 1510 [Urine:510; Other:1000] No intake/output data recorded.  SpO2: 97 % O2 Flow Rate (L/min): 15 L/min FiO2 (%): 40 %  Estimated body mass index is 23.84 kg/m as calculated from the following:   Height as of this encounter: 5' 0.98" (1.549 m).   Weight as of this encounter: 57.2 kg.  SIGNIFICANT EVENTS   REVIEW OF SYSTEMS  PATIENT IS UNABLE TO PROVIDE COMPLETE REVIEW OF SYSTEMS DUE TO SEVERE CRITICAL ILLNESS        PHYSICAL EXAMINATION:  GENERAL:critically ill appearing, +resp  distress HEAD: Normocephalic, atraumatic.  EYES: Pupils equal, round, reactive to light.  No scleral icterus.  MOUTH: Moist mucosal membrane. NECK: Supple.  PULMONARY: +rhonchi, +wheezing CARDIOVASCULAR: S1 and S2. Regular rate and rhythm. No murmurs, rubs, or gallops.  GASTROINTESTINAL: Soft, nontender, -distended.  Positive bowel sounds.   MUSCULOSKELETAL: No swelling, clubbing, or edema.  NEUROLOGIC: obtunded, GCS<8 SKIN:intact,warm,dry  MEDICATIONS: I have reviewed all medications and confirmed regimen as  documented   CULTURE RESULTS   Recent Results (from the past 240 hour(s))  Resp Panel by RT-PCR (Flu A&B, Covid) Nasopharyngeal Swab     Status: None   Collection Time: 06/29/2020  6:43 PM   Specimen: Nasopharyngeal Swab; Nasopharyngeal(NP) swabs in vial transport medium  Result Value Ref Range Status   SARS Coronavirus 2 by RT PCR NEGATIVE NEGATIVE Final    Comment: (NOTE) SARS-CoV-2 target nucleic acids are NOT DETECTED.  The SARS-CoV-2 RNA is generally detectable in upper respiratory specimens during the acute phase of infection. The lowest concentration of SARS-CoV-2 viral copies this assay can detect is 138 copies/mL. A negative result does not preclude SARS-Cov-2 infection and should not be used as the sole basis for treatment or other patient management decisions. A negative result may occur with  improper specimen collection/handling, submission of specimen other than nasopharyngeal swab, presence of viral mutation(s) within the areas targeted by this assay, and inadequate number of viral copies(<138 copies/mL). A negative result must be combined with clinical observations, patient history, and epidemiological information. The expected result is Negative.  Fact Sheet for Patients:  EntrepreneurPulse.com.au  Fact Sheet for Healthcare Providers:  IncredibleEmployment.be  This test is no t yet approved or cleared by the [place] FDA and  has been authorized for detection and/or diagnosis of SARS-CoV-2 by FDA under an Emergency Use Authorization (EUA). This EUA will remain  in effect (meaning this test can be used) for the duration of the COVID-19 declaration under Section 564(b)(1) of the Act, 21 U.S.C.section 360bbb-3(b)(1), unless the authorization is terminated  or revoked sooner.       Influenza A by PCR NEGATIVE NEGATIVE Final   Influenza B by PCR NEGATIVE NEGATIVE Final    Comment: (NOTE) The Xpert Xpress  SARS-CoV-2/FLU/RSV plus assay is intended as an aid in the diagnosis of influenza from Nasopharyngeal swab specimens and should not be used as a sole basis for treatment. Nasal washings and aspirates are unacceptable for Xpert Xpress SARS-CoV-2/FLU/RSV testing.  Fact Sheet for Patients: EntrepreneurPulse.com.au  Fact Sheet for Healthcare Providers: IncredibleEmployment.be  This test is not yet approved or cleared by the [place] FDA and has been authorized for detection and/or diagnosis of SARS-CoV-2 by FDA under an Emergency Use Authorization (EUA). This EUA will remain in effect (meaning this test can be used) for the duration of the COVID-19 declaration under Section 564(b)(1) of the Act, 21 U.S.C. section 360bbb-3(b)(1), unless the authorization is terminated or revoked.  Performed at [facility], [address] [place], [place] [address]   Blood Culture (routine x 2)     Status: None (Preliminary result)   Collection Time: 07/11/2020  6:43 PM   Specimen: BLOOD  Result Value Ref Range Status   Specimen Description BLOOD RIGHT ANTECUBITAL  Final   Special Requests   Final    BOTTLES DRAWN AEROBIC ONLY Blood Culture adequate volume   Culture   Final    NO GROWTH 3 DAYS Performed at [facility], [address] [place], [place] [address]  Report Status PENDING  Incomplete  Blood Culture (routine x 2)     Status: None (Preliminary result)   Collection Time: 06/20/2020  6:43 PM   Specimen: BLOOD  Result Value Ref Range Status   Specimen Description BLOOD LEFT ANTECUBITAL  Final   Special Requests   Final    BOTTLES DRAWN AEROBIC ONLY Blood Culture adequate volume   Culture   Final    NO GROWTH 3 DAYS Performed at [facility], [address] [place], [place] [address]    Report Status PENDING  Incomplete  Resp Panel by RT-PCR (Flu A&B, Covid) Nasopharyngeal Swab     Status: None   Collection Time:  07/18/2020  9:39 PM   Specimen: Nasopharyngeal Swab; Nasopharyngeal(NP) swabs in vial transport medium  Result Value Ref Range Status   SARS Coronavirus 2 by RT PCR NEGATIVE NEGATIVE Final    Comment: (NOTE) SARS-CoV-2 target nucleic acids are NOT DETECTED.  The SARS-CoV-2 RNA is generally detectable in upper respiratory specimens during the acute phase of infection. The lowest concentration of SARS-CoV-2 viral copies this assay can detect is 138 copies/mL. A negative result does not preclude SARS-Cov-2 infection and should not be used as the sole basis for treatment or other patient management decisions. A negative result may occur with  improper specimen collection/handling, submission of specimen other than nasopharyngeal swab, presence of viral mutation(s) within the areas targeted by this assay, and inadequate number of viral copies(<138 copies/mL). A negative result must be combined with clinical observations, patient history, and epidemiological information. The expected result is Negative.  Fact Sheet for Patients:  EntrepreneurPulse.com.au  Fact Sheet for Healthcare Providers:  IncredibleEmployment.be  This test is no t yet approved or cleared by the [place] FDA and  has been authorized for detection and/or diagnosis of SARS-CoV-2 by FDA under an Emergency Use Authorization (EUA). This EUA will remain  in effect (meaning this test can be used) for the duration of the COVID-19 declaration under Section 564(b)(1) of the Act, 21 U.S.C.section 360bbb-3(b)(1), unless the authorization is terminated  or revoked sooner.       Influenza A by PCR NEGATIVE NEGATIVE Final   Influenza B by PCR NEGATIVE NEGATIVE Final    Comment: (NOTE) The Xpert Xpress SARS-CoV-2/FLU/RSV plus assay is intended as an aid in the diagnosis of influenza from Nasopharyngeal swab specimens and should not be used as a sole basis for treatment. Nasal washings  and aspirates are unacceptable for Xpert Xpress SARS-CoV-2/FLU/RSV testing.  Fact Sheet for Patients: EntrepreneurPulse.com.au  Fact Sheet for Healthcare Providers: IncredibleEmployment.be  This test is not yet approved or cleared by the [place] FDA and has been authorized for detection and/or diagnosis of SARS-CoV-2 by FDA under an Emergency Use Authorization (EUA). This EUA will remain in effect (meaning this test can be used) for the duration of the COVID-19 declaration under Section 564(b)(1) of the Act, 21 U.S.C. section 360bbb-3(b)(1), unless the authorization is terminated or revoked.  Performed at [facility], [address] [place], [place] [address]   MRSA PCR Screening     Status: None   Collection Time: 06/23/20  2:45 AM   Specimen: Nasopharyngeal  Result Value Ref Range Status   MRSA by PCR NEGATIVE NEGATIVE Final    Comment:        The GeneXpert MRSA Assay (FDA approved for NASAL specimens only), is one component of a comprehensive MRSA colonization surveillance program. It is not intended to diagnose MRSA infection nor to  guide or monitor treatment for MRSA infections. Performed at [facility], [place], [place] [address]           IMAGING    DG Abd 1 View  Result Date: 06/24/2020 CLINICAL DATA:  Gastric tube placement EXAM: ABDOMEN - 1 VIEW COMPARISON:  07/09/2020 FINDINGS: Gastric tube in the body of the stomach unchanged in position from the prior study. Normal bowel gas pattern Right femoral vascular catheter overlying the L4-5 level. IMPRESSION: Gastric tube in the body the stomach unchanged. Normal bowel gas pattern. Electronically Signed   By: Franchot Gallo M.D.   On: 06/24/2020 12:45   DG Chest Port 1 View  Result Date: 06/24/2020 CLINICAL DATA:  Endotracheal tube.  Respiratory failure EXAM: PORTABLE CHEST 1 VIEW COMPARISON:  06/24/2020 FINDINGS: Endotracheal tube  tip approximately 2 cm above the carina. Gastric tube extends into the stomach. Diffuse bilateral airspace disease. Mild improvement on the right. No change on the left. This is most prominent in the perihilar and lower lung zones. Probable small left effusion unchanged. IMPRESSION: Endotracheal tube in satisfactory position Diffuse bilateral airspace disease with mild improvement on the right. Electronically Signed   By: Franchot Gallo M.D.   On: 06/24/2020 12:48   DG Chest Port 1 View  Result Date: 06/24/2020 CLINICAL DATA:  Hypoxia EXAM: PORTABLE CHEST 1 VIEW COMPARISON:  June 23, 2020. FINDINGS: Endotracheal tube tip is 3.5 cm above the carina. Nasogastric tube tip and side port are below the diaphragm. No pneumothorax. There has been significant clearing of airspace opacity from the left upper lobe and mid lung region with mild residual opacity in the left upper and mid lung regions remaining. Airspace opacity has partially but incompletely cleared from the right upper lobe. There is fairly extensive airspace opacity in the right mid and lower lung regions. There is atelectatic change in the left lower lobe, slightly increased. Heart is upper normal in size with pulmonary vascularity normal. No adenopathy. There is aortic atherosclerosis. No bone lesions. IMPRESSION: Tube positions as described without pneumothorax. Multifocal airspace opacity with areas of partial clearing, particularly in the right upper lobe and left upper lobe and mid lung regions. More extensive opacity remains in the right mid lung and right base regions. Stable cardiac silhouette. Aortic Atherosclerosis (ICD10-I70.0). Electronically Signed   By: Lowella Grip III M.D.   On: 06/24/2020 10:32     Nutrition Status: Nutrition Problem: Inadequate oral intake Etiology: inability to eat (pt sedated and ventilated) Signs/Symptoms: NPO status       Indwelling Urinary Catheter continued, requirement due to   Reason to  continue Indwelling Urinary Catheter strict Intake/Output monitoring for hemodynamic instability   Central Line/ continued, requirement due to  Reason to continue [place] of central venous pressure or other hemodynamic parameters and poor IV access   Ventilator continued, requirement due to severe respiratory failure   Ventilator Sedation RASS 0 to -2      ASSESSMENT AND PLAN SYNOPSIS  Severe ACUTE Hypoxic and Hypercapnic Respiratory Failuredue to acute pulm edema due to acute sCHF and dCHF exacerbation, self extubated plan for biPAP-failed biPAP and was re intubated-worsening resp failure from severe Systolic CHF and edema  Severe ACUTE Hypoxic and Hypercapnic Respiratory Failure -continue Full MV support -continue Bronchodilator Therapy -Wean Fio2 and PEEP as tolerated -VAP/VENT bundle implementation  ACUTE SYSTOLIC CARDIAC FAILURE- EF -oxygen as needed -Lasix as tolerated -follow up cardiac enzymes as indicated -follow up cardiology recs   END  STAGE KIDNEY INJURY/Renal Failure -continue Foley Catheter-assess need -Avoid nephrotoxic agents -Follow urine output, BMP -Ensure adequate renal perfusion, optimize oxygenation -Renal dose medications HD as needed    NEUROLOGY Acute toxic metabolic encephalopathy-due to hypoxia, need for sedation Goal RASS -2 to -3   CARDIAC ICU monitoring   GI GI PROPHYLAXIS as indicated  NUTRITIONAL STATUS Nutrition Status: Nutrition Problem: Inadequate oral intake Etiology: inability to eat (pt sedated and ventilated) Signs/Symptoms: NPO status     DIET-->TF's as tolerated Constipation protocol as indicated  ENDO - will use ICU hypoglycemic\Hyperglycemia protocol if indicated     ELECTROLYTES -follow labs as needed -replace as needed -pharmacy consultation and following   DVT/GI PRX ordered and assessed TRANSFUSIONS AS NEEDED MONITOR FSBS I Assessed the need for Labs I Assessed the need for  Foley I Assessed the need for Central Venous Line Family Discussion when available I Assessed the need for Mobilization I made an Assessment of medications to be adjusted accordingly Safety Risk assessment completed   CASE DISCUSSED IN MULTIDISCIPLINARY ROUNDS WITH ICU TEAM  Critical Care Time devoted to patient care services described in this note is 65 minutes.   Overall, patient is critically ill, prognosis is guarded.  Patient with Multiorgan failure and at high risk for cardiac arrest and death.   Recommend DNR status Palliative care consultation  Corrin Parker, M.D.  [facility]  Medical Director [place] Director [facility] Cardio-Pulmonary Department

## 2020-06-25 NOTE — Progress Notes (Signed)
   06/23/20 1512 06/23/20 1515 06/23/20 1530  Vitals  Temp 100.2 F (37.9 C) 100.22 F (37.9 C) 100.04 F (37.8 C)  Temp Source  --   --   --   BP 132/67 132/67 135/64  MAP (mmHg) 87 87 85  Pulse Rate 86 83 82  ECG Heart Rate 83 83 82  Resp 16 16 16   During Hemodialysis Assessment  Blood Flow Rate (mL/min) 250 mL/min 250 mL/min 250 mL/min  Arterial Pressure (mmHg) -80 mmHg -80 mmHg -90 mmHg  Venous Pressure (mmHg) 70 mmHg 70 mmHg 70 mmHg  Transmembrane Pressure (mmHg) 60 mmHg 60 mmHg 60 mmHg  Ultrafiltration Rate (mL/min) 400 mL/min 400 mL/min 400 mL/min  Dialysate Flow Rate (mL/min) 500 ml/min 500 ml/min 500 ml/min  Conductivity: Machine  14.3 14.3 14.3  HD Safety Checks Performed Yes Yes Yes  Intra-Hemodialysis Comments Tx initiated Progressing as prescribed Progressing as prescribed    06/23/20 1545 06/23/20 1600 06/23/20 1615  Vitals  Temp 99.86 F (37.7 C) 99.86 F (37.7 C) 99.68 F (37.6 C)  Temp Source Oral  --   --   BP (!) 133/97 132/65 129/64  MAP (mmHg) 109 86 82  Pulse Rate 82 81 78  ECG Heart Rate 83 81 78  Resp 19 18 16   During Hemodialysis Assessment  Blood Flow Rate (mL/min) 250 mL/min 250 mL/min 250 mL/min  Arterial Pressure (mmHg) -100 mmHg -100 mmHg -100 mmHg  Venous Pressure (mmHg) 80 mmHg 80 mmHg 80 mmHg  Transmembrane Pressure (mmHg) 60 mmHg 60 mmHg 60 mmHg  Ultrafiltration Rate (mL/min) 400 mL/min 400 mL/min 400 mL/min  Dialysate Flow Rate (mL/min) 500 ml/min 500 ml/min 500 ml/min  Conductivity: Machine  14.3 14.3 14.3  HD Safety Checks Performed Yes Yes Yes  Intra-Hemodialysis Comments Progressing as prescribed Progressing as prescribed Progressing as prescribed    06/23/20 1630 06/23/20 1645  Vitals  Temp 99.68 F (37.6 C) 99.68 F (37.6 C)  Temp Source  --   --   BP 130/65 126/68  MAP (mmHg) 85 84  Pulse Rate 77 76  ECG Heart Rate 77 76  Resp 17 16  During Hemodialysis Assessment  Blood Flow Rate (mL/min) 250 mL/min 250 mL/min   Arterial Pressure (mmHg) -100 mmHg -100 mmHg  Venous Pressure (mmHg) 90 mmHg 90 mmHg  Transmembrane Pressure (mmHg) 50 mmHg 50 mmHg  Ultrafiltration Rate (mL/min) 400 mL/min 400 mL/min  Dialysate Flow Rate (mL/min) 500 ml/min 500 ml/min  Conductivity: Machine  14.3 14.3  HD Safety Checks Performed Yes Yes  Intra-Hemodialysis Comments Progressing as prescribed Progressing as prescribed

## 2020-06-25 NOTE — Progress Notes (Signed)
66 Husband talked with Dr. Mortimer Fries and then [facility] NP with [facility] at [phone]. Translator used each time. Even when wake up assessment done patient only briefly opened her eyes. She made no attempt to pull at ETT or communicate. She has followed no commands or has not even withdrawn to pain this afternoon. Pupils are 2 mm and 3 mm and non reactive. Husband made her a DNR.

## 2020-06-25 NOTE — Progress Notes (Signed)
   06/24/20 1800 06/24/20 1815 06/24/20 1830  Vitals  Temp 97.7 F (36.5 C) (!) 97.5 F (36.4 C) (!) 97.5 F (36.4 C)  BP 124/64 123/63 124/65  MAP (mmHg) 83 82 83  BP Location Right Arm Right Arm Right Arm  BP Method Automatic Automatic Automatic  Patient Position (if appropriate) Lying Lying Lying  Pulse Rate 76 76 76  Pulse Rate Source Monitor Monitor Monitor  ECG Heart Rate 77 76 76  Resp 16 17 17   During Hemodialysis Assessment  Blood Flow Rate (mL/min) 300 mL/min 300 mL/min 300 mL/min  Arterial Pressure (mmHg) -130 mmHg -130 mmHg -140 mmHg  Venous Pressure (mmHg) 120 mmHg 120 mmHg 120 mmHg  Transmembrane Pressure (mmHg) 50 mmHg 60 mmHg 60 mmHg  Ultrafiltration Rate (mL/min) 500 mL/min 500 mL/min 500 mL/min  Dialysate Flow Rate (mL/min) 600 ml/min 600 ml/min 600 ml/min  Conductivity: Machine  14.3 14.2 14.3  HD Safety Checks Performed Yes Yes Yes  Dialysis Fluid Bolus  --   --   --   Bolus Amount (mL)  --   --   --   Intra-Hemodialysis Comments Progressing as prescribed Progressing as prescribed Progressing as prescribed    06/24/20 1845 06/24/20 1900  Vitals  Temp 97.7 F (36.5 C) 97.88 F (36.6 C)  BP 134/64 (!) 142/72  MAP (mmHg) 84 94  BP Location Right Arm Right Arm  BP Method Automatic Automatic  Patient Position (if appropriate) Lying Lying  Pulse Rate 78 83  Pulse Rate Source Monitor Monitor  ECG Heart Rate 78 83  Resp 17 17  During Hemodialysis Assessment  Blood Flow Rate (mL/min) 300 mL/min 200 mL/min  Arterial Pressure (mmHg) -140 mmHg  --   Venous Pressure (mmHg) 120 mmHg  --   Transmembrane Pressure (mmHg) 60 mmHg  --   Ultrafiltration Rate (mL/min) 500 mL/min  --   Dialysate Flow Rate (mL/min) 600 ml/min  --   Conductivity: Machine  14.3  --   HD Safety Checks Performed Yes Yes  Dialysis Fluid Bolus  --  Normal Saline  Bolus Amount (mL)  --  300 mL  Intra-Hemodialysis Comments Progressing as prescribed Tx completed

## 2020-06-26 ENCOUNTER — Other Ambulatory Visit: Payer: Self-pay

## 2020-06-26 LAB — GLUCOSE, CAPILLARY
Glucose-Capillary: 122 mg/dL — ABNORMAL HIGH (ref 70–99)
Glucose-Capillary: 143 mg/dL — ABNORMAL HIGH (ref 70–99)
Glucose-Capillary: 179 mg/dL — ABNORMAL HIGH (ref 70–99)
Glucose-Capillary: 209 mg/dL — ABNORMAL HIGH (ref 70–99)
Glucose-Capillary: 133 mg/dL — ABNORMAL HIGH (ref 70–99)
Glucose-Capillary: 156 mg/dL — ABNORMAL HIGH (ref 70–99)

## 2020-06-26 LAB — CBC
HCT: 25.7 % — ABNORMAL LOW (ref 36.0–46.0)
Hemoglobin: 8.3 g/dL — ABNORMAL LOW (ref 12.0–15.0)
MCH: 30.9 pg (ref 26.0–34.0)
MCHC: 32.3 g/dL (ref 30.0–36.0)
MCV: 95.5 fL (ref 80.0–100.0)
Platelets: 263 10*3/uL (ref 150–400)
RBC: 2.69 MIL/uL — ABNORMAL LOW (ref 3.87–5.11)
RDW: 14.1 % (ref 11.5–15.5)
WBC: 17.5 10*3/uL — ABNORMAL HIGH (ref 4.0–10.5)
nRBC: 0 % (ref 0.0–0.2)

## 2020-06-26 LAB — CULTURE, BLOOD (ROUTINE X 2)
Culture: NO GROWTH
Special Requests: ADEQUATE

## 2020-06-26 LAB — BASIC METABOLIC PANEL
Anion gap: 13 (ref 5–15)
BUN: 62 mg/dL — ABNORMAL HIGH (ref 8–23)
CO2: 25 mmol/L (ref 22–32)
Calcium: 7.5 mg/dL — ABNORMAL LOW (ref 8.9–10.3)
Chloride: 102 mmol/L (ref 98–111)
Creatinine, Ser: 3.72 mg/dL — ABNORMAL HIGH (ref 0.44–1.00)
GFR, Estimated: 13 mL/min — ABNORMAL LOW (ref >60)
Glucose, Bld: 143 mg/dL — ABNORMAL HIGH (ref 70–99)
Potassium: 3.4 mmol/L — ABNORMAL LOW (ref 3.5–5.1)
Sodium: 140 mmol/L (ref 135–145)

## 2020-06-26 LAB — PROTIME-INR
INR: 1.1 (ref 0.8–1.2)
Prothrombin Time: 14.2 seconds (ref 11.4–15.2)

## 2020-06-26 LAB — TRIGLYCERIDES: Triglycerides: 159 mg/dL — ABNORMAL HIGH (ref <150)

## 2020-06-26 LAB — MAGNESIUM: Magnesium: 1.9 mg/dL (ref 1.7–2.4)

## 2020-06-26 LAB — PHOSPHORUS: Phosphorus: 1.7 mg/dL — ABNORMAL LOW (ref 2.5–4.6)

## 2020-06-26 LAB — APTT: aPTT: 45 seconds — ABNORMAL HIGH (ref 24–36)

## 2020-06-26 MED ORDER — POTASSIUM PHOSPHATES 15 MMOLE/5ML IV SOLN
30.0000 mmol | Freq: Once | INTRAVENOUS | Status: AC
  Administered 2020-06-26: 30 mmol via INTRAVENOUS
  Filled 2020-06-26: qty 10

## 2020-06-26 MED ORDER — AMIODARONE HCL IN DEXTROSE 360-4.14 MG/200ML-% IV SOLN
30.0000 mg/h | INTRAVENOUS | Status: DC
  Administered 2020-06-27 – 2020-06-30 (×8): 30 mg/h via INTRAVENOUS
  Filled 2020-06-26 (×8): qty 200

## 2020-06-26 MED ORDER — AMIODARONE LOAD VIA INFUSION
150.0000 mg | Freq: Once | INTRAVENOUS | Status: AC
  Administered 2020-06-26: 150 mg via INTRAVENOUS
  Filled 2020-06-26: qty 83.34

## 2020-06-26 MED ORDER — HEPARIN BOLUS VIA INFUSION
2850.0000 [IU] | Freq: Once | INTRAVENOUS | Status: AC
  Administered 2020-06-26: 2850 [IU] via INTRAVENOUS
  Filled 2020-06-26: qty 2850

## 2020-06-26 MED ORDER — HEPARIN (PORCINE) 25000 UT/250ML-% IV SOLN
1050.0000 [IU]/h | INTRAVENOUS | Status: DC
  Administered 2020-06-26: 800 [IU]/h via INTRAVENOUS
  Administered 2020-06-27: 1050 [IU]/h via INTRAVENOUS
  Filled 2020-06-26 (×2): qty 250

## 2020-06-26 MED ORDER — AMIODARONE HCL IN DEXTROSE 360-4.14 MG/200ML-% IV SOLN
60.0000 mg/h | INTRAVENOUS | Status: AC
  Administered 2020-06-26 (×2): 60 mg/h via INTRAVENOUS
  Filled 2020-06-26 (×2): qty 200

## 2020-06-26 NOTE — Progress Notes (Signed)
[facility]  ROUNDING NOTE   Subjective:      Extubated herself yesterday.  Had to be reintubated after a few hours. Remains critically ill Multiple family members at bedside FiO2 28%  01/07 0701 - 01/08 0700 In: 2555.3 [I.V.:875; NG/GT:1620; IV Piggyback:60.3] Out: 1165 [Urine:1165]   Objective:  Vital signs in last 24 hours:  Temp:  [98.4 F (36.9 C)-100.22 F (37.9 C)] 99.86 F (37.7 C) (01/08 1000) Pulse Rate:  [66-88] 84 (01/08 1000) Resp:  [16] 16 (01/08 1000) BP: (108-156)/(55-81) 140/71 (01/08 1000) SpO2:  [92 %-100 %] 100 % (01/08 1000) FiO2 (%):  [28 %-100 %] 28 % (01/08 0800) Weight:  [57 kg] 57 kg (01/08 0353)  Weight change: -0.2 kg Filed Weights   06/24/20 0116 06/25/20 0325 06/26/20 0353  Weight: 59.6 kg 57.2 kg 57 kg    Intake/Output: I/O last 3 completed shifts: In: 2959.9 [I.V.:1229.7; NG/GT:1620; IV Piggyback:110.3] Out: 1325 [Urine:1325]   Intake/Output this shift:  Total I/O In: 96.4 [I.V.:75.7; IV Piggyback:20.8] Out: 200 [Urine:200]   Physical Exam: General:  No acute distress, laying in the bed  HEENT  anicteric, moist oral mucous membrane  Pulm/lungs  ET tube in place, ventilator assisted  CVS/Heart  regular rhythm, no rub or gallop  Abdomen:   Soft, nontender  Extremities:  Trace peripheral edema  Neurologic:  Sedated  Skin:  No acute rashes  Right femoral temporary dialysis catheter placed 1/4 Dr. Delana Meyer     Basic Metabolic Panel: Recent Labs  Lab 07/03/2020 1843 07/04/2020 1224 06/23/20 0559 06/24/20 0326 06/25/20 0629 06/26/20 0500  NA  --  134* 138 137 140 140  K  --  4.9 4.4 3.9 3.7 3.4*  CL  --  97* 102 100 102 102  CO2  --  24 20* _0 GLUCOSE  --  207* 96 206* 177* 143*  BUN  --  80* 63* 46* 37* 62*  CREATININE  --  6.60* 5.44* 3.68* 2.62* 3.72*  CALCIUM  --  8.6* 8.3* 7.9* 8.0* 7.5*  MG 3.0* 2.9*  --  2.2 2.1 1.9  PHOS  --  6.0*  --  5.2* 3.5 1.7*    Liver Function Tests: Recent Labs   Lab 07/13/2020 1454 07/13/2020 0223  AST 31 28  ALT 38 37  ALKPHOS 121 131*  BILITOT 0.8 0.9  PROT 6.9 7.3  ALBUMIN 3.3* 3.4*   No results for input(s): LIPASE, AMYLASE in the last 168 hours. No results for input(s): AMMONIA in the last 168 hours.  CBC: Recent Labs  Lab 06/24/2020 0223 07/17/2020 2130 06/23/20 0559 06/24/20 0326 06/25/20 0629 06/26/20 0500  WBC 12.4* 5.7 14.1* 15.9* 18.3* 17.5*  NEUTROABS 11.4*  --   --   --   --   --   HGB 10.1* 8.2* 8.6* 7.8* 8.2* 8.3*  HCT 30.6* 25.0* 27.4* 24.1* 25.9* 25.7*  MCV 93.9 94.3 97.9 94.9 95.9 95.5  PLT 399 269 278 253 277 263    Cardiac Enzymes: No results for input(s): CKTOTAL, CKMB, CKMBINDEX, TROPONINI in the last 168 hours.  BNP: Invalid input(s): POCBNP  CBG: Recent Labs  Lab 06/25/20 1555 06/25/20 1940 06/25/20 2304 06/26/20 0306 06/26/20 0755  GLUCAP 143* 113* 140* 122* 143*    Microbiology: Results for orders placed or performed during the hospital encounter of 06/19/2020  Resp Panel by RT-PCR (Flu A&B, Covid) Nasopharyngeal Swab     Status: None   Collection Time: 07/09/2020  6:43 PM  Specimen: Nasopharyngeal Swab; Nasopharyngeal(NP) swabs in vial transport medium  Result Value Ref Range Status   SARS Coronavirus 2 by RT PCR NEGATIVE NEGATIVE Final    Comment: (NOTE) SARS-CoV-2 target nucleic acids are NOT DETECTED.  The SARS-CoV-2 RNA is generally detectable in upper respiratory specimens during the acute phase of infection. The lowest concentration of SARS-CoV-2 viral copies this assay can detect is 138 copies/mL. A negative result does not preclude SARS-Cov-2 infection and should not be used as the sole basis for treatment or other patient management decisions. A negative result may occur with  improper specimen collection/handling, submission of specimen other than nasopharyngeal swab, presence of viral mutation(s) within the areas targeted by this assay, and inadequate number of viral copies(<138  copies/mL). A negative result must be combined with clinical observations, patient history, and epidemiological information. The expected result is Negative.  Fact Sheet for Patients:  EntrepreneurPulse.com.au  Fact Sheet for Healthcare Providers:  IncredibleEmployment.be  This test is no t yet approved or cleared by the [place] FDA and  has been authorized for detection and/or diagnosis of SARS-CoV-2 by FDA under an Emergency Use Authorization (EUA). This EUA will remain  in effect (meaning this test can be used) for the duration of the COVID-19 declaration under Section 564(b)(1) of the Act, 21 U.S.C.section 360bbb-3(b)(1), unless the authorization is terminated  or revoked sooner.       Influenza A by PCR NEGATIVE NEGATIVE Final   Influenza B by PCR NEGATIVE NEGATIVE Final    Comment: (NOTE) The Xpert Xpress SARS-CoV-2/FLU/RSV plus assay is intended as an aid in the diagnosis of influenza from Nasopharyngeal swab specimens and should not be used as a sole basis for treatment. Nasal washings and aspirates are unacceptable for Xpert Xpress SARS-CoV-2/FLU/RSV testing.  Fact Sheet for Patients: EntrepreneurPulse.com.au  Fact Sheet for Healthcare Providers: IncredibleEmployment.be  This test is not yet approved or cleared by the [place] FDA and has been authorized for detection and/or diagnosis of SARS-CoV-2 by FDA under an Emergency Use Authorization (EUA). This EUA will remain in effect (meaning this test can be used) for the duration of the COVID-19 declaration under Section 564(b)(1) of the Act, 21 U.S.C. section 360bbb-3(b)(1), unless the authorization is terminated or revoked.  Performed at [facility], [address] [place], [place] [address]   Blood Culture (routine x 2)     Status: None   Collection Time: 06/20/2020  6:43 PM   Specimen: BLOOD  Result Value Ref Range  Status   Specimen Description BLOOD RIGHT ANTECUBITAL  Final   Special Requests   Final    BOTTLES DRAWN AEROBIC ONLY Blood Culture adequate volume   Culture   Final    NO GROWTH 5 DAYS Performed at [facility], [address] [place], [place] [address]    Report Status 06/26/2020 FINAL  Final  Blood Culture (routine x 2)     Status: None   Collection Time: 07/18/2020  6:43 PM   Specimen: BLOOD  Result Value Ref Range Status   Specimen Description BLOOD LEFT ANTECUBITAL  Final   Special Requests   Final    BOTTLES DRAWN AEROBIC ONLY Blood Culture adequate volume   Culture   Final    NO GROWTH 5 DAYS Performed at [facility], [address] [place],  [address]    Report Status 06/26/2020 FINAL  Final  Resp Panel by RT-PCR (Flu A&B, Covid) Nasopharyngeal Swab     Status: None  Collection Time: 07/07/2020  9:39 PM   Specimen: Nasopharyngeal Swab; Nasopharyngeal(NP) swabs in vial transport medium  Result Value Ref Range Status   SARS Coronavirus 2 by RT PCR NEGATIVE NEGATIVE Final    Comment: (NOTE) SARS-CoV-2 target nucleic acids are NOT DETECTED.  The SARS-CoV-2 RNA is generally detectable in upper respiratory specimens during the acute phase of infection. The lowest concentration of SARS-CoV-2 viral copies this assay can detect is 138 copies/mL. A negative result does not preclude SARS-Cov-2 infection and should not be used as the sole basis for treatment or other patient management decisions. A negative result may occur with  improper specimen collection/handling, submission of specimen other than nasopharyngeal swab, presence of viral mutation(s) within the areas targeted by this assay, and inadequate number of viral copies(<138 copies/mL). A negative result must be combined with clinical observations, patient history, and epidemiological information. The expected result is Negative.  Fact Sheet for Patients:   EntrepreneurPulse.com.au  Fact Sheet for Healthcare Providers:  IncredibleEmployment.be  This test is no t yet approved or cleared by the [place] FDA and  has been authorized for detection and/or diagnosis of SARS-CoV-2 by FDA under an Emergency Use Authorization (EUA). This EUA will remain  in effect (meaning this test can be used) for the duration of the COVID-19 declaration under Section 564(b)(1) of the Act, 21 U.S.C.section 360bbb-3(b)(1), unless the authorization is terminated  or revoked sooner.       Influenza A by PCR NEGATIVE NEGATIVE Final   Influenza B by PCR NEGATIVE NEGATIVE Final    Comment: (NOTE) The Xpert Xpress SARS-CoV-2/FLU/RSV plus assay is intended as an aid in the diagnosis of influenza from Nasopharyngeal swab specimens and should not be used as a sole basis for treatment. Nasal washings and aspirates are unacceptable for Xpert Xpress SARS-CoV-2/FLU/RSV testing.  Fact Sheet for Patients: EntrepreneurPulse.com.au  Fact Sheet for Healthcare Providers: IncredibleEmployment.be  This test is not yet approved or cleared by the [place] FDA and has been authorized for detection and/or diagnosis of SARS-CoV-2 by FDA under an Emergency Use Authorization (EUA). This EUA will remain in effect (meaning this test can be used) for the duration of the COVID-19 declaration under Section 564(b)(1) of the Act, 21 U.S.C. section 360bbb-3(b)(1), unless the authorization is terminated or revoked.  Performed at [facility], [address] [place], [place] [address]   MRSA PCR Screening     Status: None   Collection Time: 06/23/20  2:45 AM   Specimen: Nasopharyngeal  Result Value Ref Range Status   MRSA by PCR NEGATIVE NEGATIVE Final    Comment:        The GeneXpert MRSA Assay (FDA approved for NASAL specimens only), is one component of a comprehensive MRSA  colonization surveillance program. It is not intended to diagnose MRSA infection nor to guide or monitor treatment for MRSA infections. Performed at [facility], [address] [place], [place] [address]     Coagulation Studies: No results for input(s): LABPROT, INR in the last 72 hours.  Urinalysis: No results for input(s): COLORURINE, LABSPEC, PHURINE, GLUCOSEU, HGBUR, BILIRUBINUR, KETONESUR, PROTEINUR, UROBILINOGEN, NITRITE, LEUKOCYTESUR in the last 72 hours.  Invalid input(s): APPERANCEUR    Imaging: DG Abd 1 View  Result Date: 06/24/2020 CLINICAL DATA:  Gastric tube placement EXAM: ABDOMEN - 1 VIEW COMPARISON:  07/05/2020 FINDINGS: Gastric tube in the body of the stomach unchanged in position from the prior study. Normal bowel gas pattern Right femoral vascular catheter overlying the L4-5  level. IMPRESSION: Gastric tube in the body the stomach unchanged. Normal bowel gas pattern. Electronically Signed   By: Franchot Gallo M.D.   On: 06/24/2020 12:45   DG Chest Port 1 View  Result Date: 06/24/2020 CLINICAL DATA:  Endotracheal tube.  Respiratory failure EXAM: PORTABLE CHEST 1 VIEW COMPARISON:  06/24/2020 FINDINGS: Endotracheal tube tip approximately 2 cm above the carina. Gastric tube extends into the stomach. Diffuse bilateral airspace disease. Mild improvement on the right. No change on the left. This is most prominent in the perihilar and lower lung zones. Probable small left effusion unchanged. IMPRESSION: Endotracheal tube in satisfactory position Diffuse bilateral airspace disease with mild improvement on the right. Electronically Signed   By: Franchot Gallo M.D.   On: 06/24/2020 12:48     Medications:   . famotidine (PEPCID) IV Stopped (06/25/20 2122)  . feeding supplement (VITAL AF 1.2 CAL) 45 mL/hr at 06/26/20 0600  . fentaNYL infusion INTRAVENOUS 200 mcg/hr (06/26/20 0837)  . potassium PHOSPHATE IVPB (in mmol) 85 mL/hr at 06/26/20 0837  . propofol (DIPRIVAN)  infusion 20 mcg/kg/min (06/26/20 0837)   . aspirin  81 mg Per Tube Daily  . chlorhexidine gluconate (MEDLINE KIT)  15 mL Mouth Rinse BID  . Chlorhexidine Gluconate Cloth  6 each Topical Q0600  . docusate  100 mg Per Tube BID  . epoetin (EPOGEN/PROCRIT) injection  10,000 Units Intravenous Q T,Th,Sa-HD  . feeding supplement (PROSource TF)  45 mL Per Tube Daily  . furosemide  40 mg Intravenous BID  . heparin  5,000 Units Subcutaneous Q8H  . hydrALAZINE  25 mg Per Tube BID  . insulin aspart  0-20 Units Subcutaneous Q4H  . mouth rinse  15 mL Mouth Rinse 10 times per day  . multivitamin  15 mL Per Tube Daily  . polyethylene glycol  17 g Per Tube Daily   acetaminophen **OR** acetaminophen, fentaNYL, haloperidol lactate, ipratropium-albuterol, labetalol, LORazepam, ondansetron (ZOFRAN) IV, vecuronium  Assessment/ Plan:  Ms. Stephanie Casey is a 65 y.o. 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   1. End stage renal disease from progression of chronic kidney disease secondary to nephrotic syndrome from FSGS collapsing variant biopsy 03/2020.  Followed by [facility].  Outpatient baseline creatinine of 4.75/GFR 9 in October 2021  Completed 3 dialysis treatments. Evaluate daily for dialysis need Urine output appears to have improved Electrolytes and volume status are acceptable.  No acute indication for dialysis at present Currently getting IV furosemide 40 mg twice a day  2. Acute respiratory failure with pulmonary edema requring intubation and mechanical ventilation.  Ventilator assisted, FiO2 28%  3. Anemia with chronic kidney disease:  Lab Results  Component Value Date   HGB 8.3 (L) 06/26/2020    - EPO with  dialysis   LOS: 5 Stephanie Casey 1/8/202210:17 AM

## 2020-06-26 NOTE — Progress Notes (Signed)
[place] checked in w/several family members in rm. per ICU secretary's mention that pt.'s family may need support.  Family shares no needs at this time and seem to be supporting one another satisfactorily.  CH remains available as needed.

## 2020-06-26 NOTE — Consult Note (Signed)
ANTICOAGULATION CONSULT NOTE - Initial Consult  Pharmacy Consult for heparin infusion Indication: atrial fibrillation  Allergies  Allergen Reactions  . Hydrochlorothiazide     Pancreatitis    Patient Measurements: Height: 5' 0.98" (154.9 cm) Weight: 57 kg (125 lb 10.6 oz) IBW/kg (Calculated) : 47.76 Heparin Dosing Weight: 57 kg  Vital Signs: Temp: 100.58 F (38.1 C) (01/08 1800) Temp Source: Esophageal (01/08 1600) BP: 115/60 (01/08 1800) Pulse Rate: 88 (01/08 1800)  Labs: Recent Labs    06/24/20 0326 06/25/20 0629 06/26/20 0500 06/26/20 2045  HGB 7.8* 8.2* 8.3*  --   HCT 24.1* 25.9* 25.7*  --   PLT 253 277 263  --   APTT  --   --   --  45*  LABPROT  --   --   --  14.2  INR  --   --   --  1.1  CREATININE 3.68* 2.62* 3.72*  --     Estimated Creatinine Clearance: 11.7 mL/min (A) (by C-G formula based on SCr of 3.72 mg/dL (H)).   Medical History: Past Medical History:  Diagnosis Date  . Diabetes mellitus without complication ([place])   . 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 Hyperlipidemia   . Hypertension     Medications:  Heparin SQ 5,000 units -- last dose 1/8 1307  Assessment: 65 y.o. female admitted with acute hypoxic respiratory failure, AKI on CKD stage IV. Patient with new onset Afib. Pharmacy has been consulted for heparin infusion. Hemoglobin stable ~ 8.0.   Goal of Therapy:  Heparin level 0.3-0.7 units/ml Monitor platelets by anticoagulation protocol: Yes   Plan:  Give 2850 units bolus x 1 Start heparin infusion at 800 units/hr Check anti-Xa level in 8 hours and daily while on heparin Continue to monitor H&H and platelets  Dorothe Pea, PharmD, BCPS Clinical Pharmacist  06/26/2020,9:46 PM

## 2020-06-26 NOTE — Progress Notes (Signed)
CRITICAL CARE NOTE 65 y.o. 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 Failed trial of BiPAP in ED requiring emergent intubation.  1/4: Right Femoral Trialysis placed by Vascular Surgery  Significant Diagnostic Tests:  1/3: CXR>>Moderate to marked severity predominantly bilateral perihilar, bilateral suprahilar and bilateral infrahilar infiltrates are seen. Extension to involve the bilateral lung bases is also noted. There are small bilateral pleural effusions. No pneumothorax is identified. The cardiac silhouette is moderately enlarged. The visualized skeletal structures are unremarkable. 1/3: Renal US>>Atrophic and echogenic bilateral kidneys suggesting renal parenchymal disease with an underlying mass within the left kidney not excluded. Recommend MRI or CT renal protocol for further evaluation.  Micro Data:  1/3: SARS-CoV-2 PCR>>negative 1/3: Influenza A&B PCR>>negative 1/3: Blood culture x2>> 1/4: HIV Screen>>nonreactive 1/4: Hepatitis B Surface Ag>>nonreactive 1/4:Hepatitis B S ab>>nonreactive 1/4: Hepatitis B core Ab, IgM>>nonreactive 1/4: Hepatitis C Ab>>nonreactive      1/4 admitted to ICU for acute pulm edema 1/5 remains on vent did NOT tolerate HD due to hypotension 1/6 self extubated-on biPAP 1/6 re intubated emergently 1/7 Met with palliative and made DNR   CC  follow up respiratory failure   DNR yesterday after meeting with palliative care.  Continues on the ventilator, sedated with poor mental status  Vent Mode: PRVC FiO2 (%):  [28 %-100 %] 28 % Set Rate:  [16 bmp] 16 bmp Vt Set:  [450 mL] 450 mL PEEP:  [5 cmH20] 5 cmH20  CBC    Component Value Date/Time   WBC 17.5 (H) 06/26/2020 0500   RBC 2.69 (L) 06/26/2020 0500   HGB 8.3 (L) 06/26/2020 0500   HGB 12.4 01/15/2013 2354   HCT 25.7 (L) 06/26/2020 0500   HCT 37.6 01/15/2013 2354   PLT 263  06/26/2020 0500   PLT 265 01/15/2013 2354   MCV 95.5 06/26/2020 0500   MCV 85 01/15/2013 2354   MCH 30.9 06/26/2020 0500   MCHC 32.3 06/26/2020 0500   RDW 14.1 06/26/2020 0500   RDW 13.5 01/15/2013 2354   LYMPHSABS 0.5 (L) 07/02/2020 0223   MONOABS 0.4 07/08/2020 0223   EOSABS 0.0 07/18/2020 0223   BASOSABS 0.0 07/05/2020 0223   BMP Latest Ref Rng & Units 06/26/2020 06/25/2020 06/24/2020  Glucose 70 - 99 mg/dL 143(H) 177(H) 206(H)  BUN 8 - 23 mg/dL 62(H) 37(H) 46(H)  Creatinine 0.44 - 1.00 mg/dL 3.72(H) 2.62(H) 3.68(H)  Sodium 135 - 145 mmol/L 140 140 137  Potassium 3.5 - 5.1 mmol/L 3.4(L) 3.7 3.9  Chloride 98 - 111 mmol/L 102 102 100  CO2 22 - 32 mmol/L 25 26 24  Calcium 8.9 - 10.3 mg/dL 7.5(L) 8.0(L) 7.9(L)      BP (!) 142/68   Pulse 84   Temp 100.22 F (37.9 C)   Resp 16   Ht 5' 0.98" (1.549 m)   Wt 57 kg   SpO2 100%   BMI 23.76 kg/m    I/O last 3 completed shifts: In: 2959.9 [I.V.:1229.7; NG/GT:1620; IV Piggyback:110.3] Out: 1325 [Urine:1325] Total I/O In: 96.4 [I.V.:75.7; IV Piggyback:20.8] Out: 200 [Urine:200]  SpO2: 100 % O2 Flow Rate (L/min): 15 L/min FiO2 (%): 28 %  Estimated body mass index is 23.76 kg/m as calculated from the following:   Height as of this encounter: 5' 0.98" (1.549 m).   Weight as of this encounter: 57 kg.  SIGNIFICANT EVENTS   REVIEW OF SYSTEMS  PATIENT IS UNABLE TO PROVIDE COMPLETE REVIEW   OF SYSTEMS DUE TO SEVERE CRITICAL ILLNESS        PHYSICAL EXAMINATION:  Blood pressure (!) 142/68, pulse 84, temperature 100.22 F (37.9 C), resp. rate 16, height 5' 0.98" (1.549 m), weight 57 kg, SpO2 100 %. Gen:      No acute distress, critically ill-appearing HEENT:  EOMI, sclera anicteric Neck:     No masses; no thyromegaly ETT Lungs:    Clear to auscultation bilaterally; normal respiratory effort CV:         Regular rate and rhythm; no murmurs Abd:      + bowel sounds; soft, non-tender; no palpable masses, no distension Ext:     No edema; adequate peripheral perfusion Skin:      Warm and dry; no rash Neuro: Unresponsive  Labs/imaging reviewed Significant for potassium 3.4, creatinine 3.72 WBC 17.5, hemoglobin 8.3, platelets 263 No new imaging  CULTURE RESULTS   Recent Results (from the past 240 hour(s))  Resp Panel by RT-PCR (Flu A&B, Covid) Nasopharyngeal Swab     Status: None   Collection Time: 06/20/2020  6:43 PM   Specimen: Nasopharyngeal Swab; Nasopharyngeal(NP) swabs in vial transport medium  Result Value Ref Range Status   SARS Coronavirus 2 by RT PCR NEGATIVE NEGATIVE Final    Comment: (NOTE) SARS-CoV-2 target nucleic acids are NOT DETECTED.  The SARS-CoV-2 RNA is generally detectable in upper respiratory specimens during the acute phase of infection. The lowest concentration of SARS-CoV-2 viral copies this assay can detect is 138 copies/mL. A negative result does not preclude SARS-Cov-2 infection and should not be used as the sole basis for treatment or other patient management decisions. A negative result may occur with  improper specimen collection/handling, submission of specimen other than nasopharyngeal swab, presence of viral mutation(s) within the areas targeted by this assay, and inadequate number of viral copies(<138 copies/mL). A negative result must be combined with clinical observations, patient history, and epidemiological information. The expected result is Negative.  Fact Sheet for Patients:  https://www.fda.gov/media/152166/download  Fact Sheet for Healthcare Providers:  https://www.fda.gov/media/152162/download  This test is no t yet approved or cleared by the [place] FDA and  has been authorized for detection and/or diagnosis of SARS-CoV-2 by FDA under an Emergency Use Authorization (EUA). This EUA will remain  in effect (meaning this test can be used) for the duration of the COVID-19 declaration under Section 564(b)(1) of the Act, 21 U.S.C.section 360bbb-3(b)(1),  unless the authorization is terminated  or revoked sooner.       Influenza A by PCR NEGATIVE NEGATIVE Final   Influenza B by PCR NEGATIVE NEGATIVE Final    Comment: (NOTE) The Xpert Xpress SARS-CoV-2/FLU/RSV plus assay is intended as an aid in the diagnosis of influenza from Nasopharyngeal swab specimens and should not be used as a sole basis for treatment. Nasal washings and aspirates are unacceptable for Xpert Xpress SARS-CoV-2/FLU/RSV testing.  Fact Sheet for Patients: https://www.fda.gov/media/152166/download  Fact Sheet for Healthcare Providers: https://www.fda.gov/media/152162/download  This test is not yet approved or cleared by the [place] FDA and has been authorized for detection and/or diagnosis of SARS-CoV-2 by FDA under an Emergency Use Authorization (EUA). This EUA will remain in effect (meaning this test can be used) for the duration of the COVID-19 declaration under Section 564(b)(1) of the Act, 21 U.S.C. section 360bbb-3(b)(1), unless the authorization is terminated or revoked.  Performed at [facility], [address] [place], [place] [address]   Blood Culture (routine x 2)     Status: None     Collection Time: 07/11/2020  6:43 PM   Specimen: BLOOD  Result Value Ref Range Status   Specimen Description BLOOD RIGHT ANTECUBITAL  Final   Special Requests   Final    BOTTLES DRAWN AEROBIC ONLY Blood Culture adequate volume   Culture   Final    NO GROWTH 5 DAYS Performed at [facility], [address] [place], [place] [address]    Report Status 06/26/2020 FINAL  Final  Blood Culture (routine x 2)     Status: None   Collection Time: 07/06/2020  6:43 PM   Specimen: BLOOD  Result Value Ref Range Status   Specimen Description BLOOD LEFT ANTECUBITAL  Final   Special Requests   Final    BOTTLES DRAWN AEROBIC ONLY Blood Culture adequate volume   Culture   Final    NO GROWTH 5 DAYS Performed at [facility], [address]  [place], [place] [address]    Report Status 06/26/2020 FINAL  Final  Resp Panel by RT-PCR (Flu A&B, Covid) Nasopharyngeal Swab     Status: None   Collection Time: 07/14/2020  9:39 PM   Specimen: Nasopharyngeal Swab; Nasopharyngeal(NP) swabs in vial transport medium  Result Value Ref Range Status   SARS Coronavirus 2 by RT PCR NEGATIVE NEGATIVE Final    Comment: (NOTE) SARS-CoV-2 target nucleic acids are NOT DETECTED.  The SARS-CoV-2 RNA is generally detectable in upper respiratory specimens during the acute phase of infection. The lowest concentration of SARS-CoV-2 viral copies this assay can detect is 138 copies/mL. A negative result does not preclude SARS-Cov-2 infection and should not be used as the sole basis for treatment or other patient management decisions. A negative result may occur with  improper specimen collection/handling, submission of specimen other than nasopharyngeal swab, presence of viral mutation(s) within the areas targeted by this assay, and inadequate number of viral copies(<138 copies/mL). A negative result must be combined with clinical observations, patient history, and epidemiological information. The expected result is Negative.  Fact Sheet for Patients:  EntrepreneurPulse.com.au  Fact Sheet for Healthcare Providers:  IncredibleEmployment.be  This test is no t yet approved or cleared by the [place] FDA and  has been authorized for detection and/or diagnosis of SARS-CoV-2 by FDA under an Emergency Use Authorization (EUA). This EUA will remain  in effect (meaning this test can be used) for the duration of the COVID-19 declaration under Section 564(b)(1) of the Act, 21 U.S.C.section 360bbb-3(b)(1), unless the authorization is terminated  or revoked sooner.       Influenza A by PCR NEGATIVE NEGATIVE Final   Influenza B by PCR NEGATIVE NEGATIVE Final    Comment: (NOTE) The Xpert Xpress SARS-CoV-2/FLU/RSV plus assay is  intended as an aid in the diagnosis of influenza from Nasopharyngeal swab specimens and should not be used as a sole basis for treatment. Nasal washings and aspirates are unacceptable for Xpert Xpress SARS-CoV-2/FLU/RSV testing.  Fact Sheet for Patients: EntrepreneurPulse.com.au  Fact Sheet for Healthcare Providers: IncredibleEmployment.be  This test is not yet approved or cleared by the [place] FDA and has been authorized for detection and/or diagnosis of SARS-CoV-2 by FDA under an Emergency Use Authorization (EUA). This EUA will remain in effect (meaning this test can be used) for the duration of the COVID-19 declaration under Section 564(b)(1) of the Act, 21 U.S.C. section 360bbb-3(b)(1), unless the authorization is terminated or revoked.  Performed at [facility], [address], [place], [place] [address]   MRSA PCR Screening  Status: None   Collection Time: 06/23/20  2:45 AM   Specimen: Nasopharyngeal  Result Value Ref Range Status   MRSA by PCR NEGATIVE NEGATIVE Final    Comment:        The GeneXpert MRSA Assay (FDA approved for NASAL specimens only), is one component of a comprehensive MRSA colonization surveillance program. It is not intended to diagnose MRSA infection nor to guide or monitor treatment for MRSA infections. Performed at [facility], [address] [place], [place] [address]           IMAGING    No results found.   Nutrition Status: Nutrition Problem: Inadequate oral intake Etiology: inability to eat (pt sedated and ventilated) Signs/Symptoms: NPO status       Indwelling Urinary Catheter continued, requirement due to   Reason to continue Indwelling Urinary Catheter strict Intake/Output monitoring for hemodynamic instability   Central Line/ continued, requirement due to  Reason to continue [place] of central venous pressure or other hemodynamic  parameters and poor IV access   Ventilator continued, requirement due to severe respiratory failure   Ventilator Sedation RASS 0 to -2      ASSESSMENT AND PLAN SYNOPSIS  Severe ACUTE Hypoxic and Hypercapnic Respiratory Failuredue to acute pulm edema due to acute sCHF and dCHF exacerbation, self extubated plan for biPAP-failed biPAP and was re intubated-worsening resp failure from severe Systolic CHF and edema  Severe ACUTE Hypoxic and Hypercapnic Respiratory Failure Continue mechanical support, bronchodilator therapy SBT's when mental status improved  ACUTE SYSTOLIC CARDIAC FAILURE- EF 40-45% Moderate pulmonary hypertension Diuresis limited by hypotension  END STAGE KIDNEY INJURY/Renal Failure Baseline CKD, 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 Has not needed repeat dialysis as she is making urine Nephrology on board    NEUROLOGY Acute toxic metabolic encephalopathy-due to hypoxia Wean down sedation  CARDIAC ICU monitoring   GI GI PROPHYLAXIS as indicated  NUTRITIONAL STATUS Nutrition Status: Nutrition Problem: Inadequate oral intake Etiology: inability to eat (pt sedated and ventilated) Signs/Symptoms: NPO status     DIET-->TF's as tolerated Constipation protocol as indicated  ENDO - will use ICU hypoglycemic\Hyperglycemia protocol if indicated     ELECTROLYTES -follow labs as needed -replace as needed -pharmacy consultation and following  Goals of care Palliative care on board.  Transitioned to DNR. No escalation of care.  Family is considering withdrawal if no improvement in the next few days Updated at bedside via interpreter 1/8  DVT/GI PRX ordered and assessed TRANSFUSIONS AS NEEDED MONITOR FSBS I Assessed the need for Labs I Assessed the need for Foley I Assessed the need for Central Venous Line Family Discussion when available I Assessed the need for Mobilization I made an Assessment of medications to be adjusted accordingly Safety  Risk assessment completed   CASE DISCUSSED IN MULTIDISCIPLINARY ROUNDS WITH ICU TEAM  The patient is critically ill with multiple organ system failure and requires high complexity decision making for assessment and support, frequent evaluation and titration of therapies, advanced monitoring, review of radiographic studies and interpretation of complex data.   Critical Care Time devoted to patient care services, exclusive of separately billable procedures, described in this note is 45 minutes.   Marshell Garfinkel MD [facility] Please see [email or web address] for pager details.  06/26/2020, 10:07 AM

## 2020-06-27 ENCOUNTER — Inpatient Hospital Stay: Payer: Medicaid Other

## 2020-06-27 LAB — BASIC METABOLIC PANEL
Anion gap: 13 (ref 5–15)
BUN: 81 mg/dL — ABNORMAL HIGH (ref 8–23)
CO2: 24 mmol/L (ref 22–32)
Calcium: 7.1 mg/dL — ABNORMAL LOW (ref 8.9–10.3)
Chloride: 101 mmol/L (ref 98–111)
Creatinine, Ser: 4.37 mg/dL — ABNORMAL HIGH (ref 0.44–1.00)
GFR, Estimated: 11 mL/min — ABNORMAL LOW (ref >60)
Glucose, Bld: 176 mg/dL — ABNORMAL HIGH (ref 70–99)
Potassium: 3.9 mmol/L (ref 3.5–5.1)
Sodium: 138 mmol/L (ref 135–145)

## 2020-06-27 LAB — GLUCOSE, CAPILLARY
Glucose-Capillary: 140 mg/dL — ABNORMAL HIGH (ref 70–99)
Glucose-Capillary: 156 mg/dL — ABNORMAL HIGH (ref 70–99)
Glucose-Capillary: 161 mg/dL — ABNORMAL HIGH (ref 70–99)
Glucose-Capillary: 161 mg/dL — ABNORMAL HIGH (ref 70–99)
Glucose-Capillary: 169 mg/dL — ABNORMAL HIGH (ref 70–99)
Glucose-Capillary: 173 mg/dL — ABNORMAL HIGH (ref 70–99)

## 2020-06-27 LAB — CBC
HCT: 23.5 % — ABNORMAL LOW (ref 36.0–46.0)
Hemoglobin: 7.5 g/dL — ABNORMAL LOW (ref 12.0–15.0)
MCH: 30 pg (ref 26.0–34.0)
MCHC: 31.9 g/dL (ref 30.0–36.0)
MCV: 94 fL (ref 80.0–100.0)
Platelets: 214 10*3/uL (ref 150–400)
RBC: 2.5 MIL/uL — ABNORMAL LOW (ref 3.87–5.11)
RDW: 14.2 % (ref 11.5–15.5)
WBC: 17.9 10*3/uL — ABNORMAL HIGH (ref 4.0–10.5)
nRBC: 0 % (ref 0.0–0.2)

## 2020-06-27 LAB — PHOSPHORUS: Phosphorus: 3.7 mg/dL (ref 2.5–4.6)

## 2020-06-27 LAB — HEPARIN LEVEL (UNFRACTIONATED)
Heparin Unfractionated: 0.26 IU/mL — ABNORMAL LOW (ref 0.30–0.70)
Heparin Unfractionated: 0.13 IU/mL — ABNORMAL LOW (ref 0.30–0.70)

## 2020-06-27 LAB — MAGNESIUM: Magnesium: 1.9 mg/dL (ref 1.7–2.4)

## 2020-06-27 LAB — TRIGLYCERIDES: Triglycerides: 150 mg/dL — ABNORMAL HIGH (ref <150)

## 2020-06-27 MED ORDER — HEPARIN BOLUS VIA INFUSION
900.0000 [IU] | Freq: Once | INTRAVENOUS | Status: AC
  Administered 2020-06-27: 900 [IU] via INTRAVENOUS
  Filled 2020-06-27: qty 900

## 2020-06-27 MED ORDER — HEPARIN BOLUS VIA INFUSION
1700.0000 [IU] | Freq: Once | INTRAVENOUS | Status: AC
  Administered 2020-06-27: 1700 [IU] via INTRAVENOUS
  Filled 2020-06-27: qty 1700

## 2020-06-27 NOTE — Progress Notes (Addendum)
   06/27/20 0234  Clinical Encounter Type  Visited With Family  Visit Type Initial  Referral From Family  Consult/Referral To Chaplain  Chaplain responded to a page to go to [place]. Family is requesting a priest. Chaplain left message for Father Eddie Dibbles and is waiting to hear back from him.  Father Eddie Dibbles called chaplain and said that he will be here shortly. Chaplain will let family know that Father Eddie Dibbles is coming.

## 2020-06-27 NOTE — Consult Note (Signed)
[facility] for heparin infusion Indication: atrial fibrillation  Allergies  Allergen Reactions  . Hydrochlorothiazide     Pancreatitis    Patient Measurements: Height: 5' 0.98" (154.9 cm) Weight: 63.7 kg (140 lb 6.9 oz) IBW/kg (Calculated) : 47.76 Heparin Dosing Weight: 57 kg  Vital Signs: Temp: 100.04 F (37.8 C) (01/09 1600) Temp Source: Esophageal (01/09 1500) BP: 125/57 (01/09 1600) Pulse Rate: 87 (01/09 1600)  Labs: Recent Labs    06/25/20 0629 06/26/20 0500 06/26/20 2045 06/27/20 0319 06/27/20 1641  HGB 8.2* 8.3*  --  7.5*  --   HCT 25.9* 25.7*  --  23.5*  --   PLT 277 263  --  214  --   APTT  --   --  45*  --   --   LABPROT  --   --  14.2  --   --   INR  --   --  1.1  --   --   HEPARINUNFRC  --   --   --  0.26* 0.13*  CREATININE 2.62* 3.72*  --  4.37*  --     Estimated Creatinine Clearance: 11.3 mL/min (A) (by C-G formula based on SCr of 4.37 mg/dL (H)).   Medical History: Past Medical History:  Diagnosis Date  . Diabetes mellitus without complication ([place])   . 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 Hyperlipidemia   . Hypertension     Medications:  Heparin SQ 5,000 units -- last dose 1/8 1307  Assessment: 65 y.o. female admitted with acute hypoxic respiratory failure, AKI on CKD stage IV. Patient with new onset Afib. Pharmacy has been consulted for heparin infusion. Hemoglobin stable ~ 8.0.   1/9 0319 HL 0.26  1/9 1641 HL 0.13, subtherapeutic s/p 900 units bolus x 1 followed by  increased heparin infusion to 900 units/hr- both were given. Given this level was lower than previous HL, will need to ensure heparin wasn't interrupted. Inquired with nursing whether heparin was interrupted and confirmed no line occlusions or interruptions.   Goal of Therapy:  Heparin level 0.3-0.7 units/ml Monitor platelets by anticoagulation protocol: Yes   Plan:  Heparin level is subtherapeutic. Will give 1700  units bolus x 1. Will increase heparin infusion to 1050 units/hr. Recheck heparin level in 8 hours. CBC daily while on heparin.   Rowland Lathe, PharmD Clinical Pharmacist  06/27/2020,5:21 PM

## 2020-06-27 NOTE — Consult Note (Signed)
[facility] for heparin infusion Indication: atrial fibrillation  Allergies  Allergen Reactions  . Hydrochlorothiazide     Pancreatitis    Patient Measurements: Height: 5' 0.98" (154.9 cm) Weight: 63.7 kg (140 lb 6.9 oz) IBW/kg (Calculated) : 47.76 Heparin Dosing Weight: 57 kg  Vital Signs: Temp: 99.32 F (37.4 C) (01/09 0400) Temp Source: Esophageal (01/09 0400) BP: 124/60 (01/09 0400) Pulse Rate: 75 (01/09 0400)  Labs: Recent Labs    06/25/20 0629 06/26/20 0500 06/26/20 2045 06/27/20 0319  HGB 8.2* 8.3*  --  7.5*  HCT 25.9* 25.7*  --  23.5*  PLT 277 263  --  214  APTT  --   --  45*  --   LABPROT  --   --  14.2  --   INR  --   --  1.1  --   HEPARINUNFRC  --   --   --  0.26*  CREATININE 2.62* 3.72*  --  4.37*    Estimated Creatinine Clearance: 11.3 mL/min (A) (by C-G formula based on SCr of 4.37 mg/dL (H)).   Medical History: Past Medical History:  Diagnosis Date  . Diabetes mellitus without complication ([place])   . 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 Hyperlipidemia   . Hypertension     Medications:  Heparin SQ 5,000 units -- last dose 1/8 1307  Assessment: 65 y.o. female admitted with acute hypoxic respiratory failure, AKI on CKD stage IV. Patient with new onset Afib. Pharmacy has been consulted for heparin infusion. Hemoglobin stable ~ 8.0.   1/9 0319 HL 0.26   Goal of Therapy:  Heparin level 0.3-0.7 units/ml Monitor platelets by anticoagulation protocol: Yes   Plan:  Heparin level is subtherapeutic. Will give 900 units bolus x 1. Will increase heparin infusion to 900 units/hr. Recheck heparin level in 8 hours. CBC daily while on heparin.   Oswald Hillock, PharmD, BCPS Clinical Pharmacist  06/27/2020,7:23 AM

## 2020-06-27 NOTE — Progress Notes (Signed)
CRITICAL CARE NOTE 65 y.o. 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 Failed trial of BiPAP in ED requiring emergent intubation.  1/4: Right Femoral Trialysis placed by Vascular Surgery  Significant Diagnostic Tests:  1/3: CXR>>Moderate to marked severity predominantly bilateral perihilar, bilateral suprahilar and bilateral infrahilar infiltrates are seen. Extension to involve the bilateral lung bases is also noted. There are small bilateral pleural effusions. No pneumothorax is identified. The cardiac silhouette is moderately enlarged. The visualized skeletal structures are unremarkable. 1/3: Renal US>>Atrophic and echogenic bilateral kidneys suggesting renal parenchymal disease with an underlying mass within the left kidney not excluded. Recommend MRI or CT renal protocol for further evaluation.  Micro Data:  1/3: SARS-CoV-2 PCR>>negative 1/3: Influenza A&B PCR>>negative 1/3: Blood culture x2>> 1/4: HIV Screen>>nonreactive 1/4: Hepatitis B Surface Ag>>nonreactive 1/4:Hepatitis B S ab>>nonreactive 1/4: Hepatitis B core Ab, IgM>>nonreactive 1/4: Hepatitis C Ab>>nonreactive      1/4 admitted to ICU for acute pulm edema 1/5 remains on vent did NOT tolerate HD due to hypotension 1/6 self extubated-on biPAP 1/6 re intubated emergently 1/7 Met with palliative and made DNR   CC  follow up respiratory failure   Continues on the ventilator, sedated with poor mental status.  Vent Mode: PRVC FiO2 (%):  [28 %] 28 % Set Rate:  [16 bmp] 16 bmp Vt Set:  [450 mL] 450 mL PEEP:  [5 cmH20] 5 cmH20 Pressure Support:  [5 cmH20] 5 cmH20 Plateau Pressure:  [17 cmH20-18 cmH20] 18 cmH20  CBC    Component Value Date/Time   WBC 17.9 (H) 06/27/2020 0319   RBC 2.50 (L) 06/27/2020 0319   HGB 7.5 (L) 06/27/2020 0319   HGB 12.4 01/15/2013 2354   HCT 23.5 (L) 06/27/2020 0319   HCT  37.6 01/15/2013 2354   PLT 214 06/27/2020 0319   PLT 265 01/15/2013 2354   MCV 94.0 06/27/2020 0319   MCV 85 01/15/2013 2354   MCH 30.0 06/27/2020 0319   MCHC 31.9 06/27/2020 0319   RDW 14.2 06/27/2020 0319   RDW 13.5 01/15/2013 2354   LYMPHSABS 0.5 (L) 06/28/2020 0223   MONOABS 0.4 06/26/2020 0223   EOSABS 0.0 07/07/2020 0223   BASOSABS 0.0 06/25/2020 0223   BMP Latest Ref Rng & Units 06/27/2020 06/26/2020 06/25/2020  Glucose 70 - 99 mg/dL 176(H) 143(H) 177(H)  BUN 8 - 23 mg/dL 81(H) 62(H) 37(H)  Creatinine 0.44 - 1.00 mg/dL 4.37(H) 3.72(H) 2.62(H)  Sodium 135 - 145 mmol/L 138 140 140  Potassium 3.5 - 5.1 mmol/L 3.9 3.4(L) 3.7  Chloride 98 - 111 mmol/L 101 102 102  CO2 22 - 32 mmol/L 24 25 26   Calcium 8.9 - 10.3 mg/dL 7.1(L) 7.5(L) 8.0(L)      BP (!) 123/56   Pulse 77   Temp 98.42 F (36.9 C)   Resp 15   Ht 5' 0.98" (1.549 m)   Wt 63.7 kg   SpO2 100%   BMI 26.55 kg/m    I/O last 3 completed shifts: In: 2647.5 [I.V.:1087.3; NG/GT:940; IV Piggyback:620.2] Out: 1465 [Urine:1465] Total I/O In: 199.5 [I.V.:199.5] Out: 475 [Urine:475]  SpO2: 100 % O2 Flow Rate (L/min): 15 L/min FiO2 (%): 28 %  Estimated body mass index is 26.55 kg/m as calculated from the following:   Height as of this encounter: 5' 0.98" (1.549 m).   Weight as of this encounter: 63.7 kg.  SIGNIFICANT EVENTS   REVIEW OF SYSTEMS  PATIENT IS UNABLE  TO PROVIDE COMPLETE REVIEW OF SYSTEMS DUE TO SEVERE CRITICAL ILLNESS        PHYSICAL EXAMINATION: Blood pressure (!) 123/56, pulse 77, temperature 98.42 F (36.9 C), resp. rate 15, height 5' 0.98" (1.549 m), weight 63.7 kg, SpO2 100 %. Gen:      No acute distress HEENT:  EOMI, sclera anicteric Neck:     No masses; no thyromegaly Lungs:    Clear to auscultation bilaterally; normal respiratory effort CV:         Regular rate and rhythm; no murmurs Abd:      + bowel sounds; soft, non-tender; no palpable masses, no distension Ext:    No edema;  adequate peripheral perfusion Skin:      Warm and dry; no rash Neuro: Sedated, unresponsive  Labs/imaging reviewed Significant for BUN/creatinine 81/4.37, hemoglobin 7.5 Chest x-ray today with bilateral airspace disease, bilateral effusions  CULTURE RESULTS   Recent Results (from the past 240 hour(s))  Resp Panel by RT-PCR (Flu A&B, Covid) Nasopharyngeal Swab     Status: None   Collection Time: 06/25/2020  6:43 PM   Specimen: Nasopharyngeal Swab; Nasopharyngeal(NP) swabs in vial transport medium  Result Value Ref Range Status   SARS Coronavirus 2 by RT PCR NEGATIVE NEGATIVE Final    Comment: (NOTE) SARS-CoV-2 target nucleic acids are NOT DETECTED.  The SARS-CoV-2 RNA is generally detectable in upper respiratory specimens during the acute phase of infection. The lowest concentration of SARS-CoV-2 viral copies this assay can detect is 138 copies/mL. A negative result does not preclude SARS-Cov-2 infection and should not be used as the sole basis for treatment or other patient management decisions. A negative result may occur with  improper specimen collection/handling, submission of specimen other than nasopharyngeal swab, presence of viral mutation(s) within the areas targeted by this assay, and inadequate number of viral copies(<138 copies/mL). A negative result must be combined with clinical observations, patient history, and epidemiological information. The expected result is Negative.  Fact Sheet for Patients:  EntrepreneurPulse.com.au  Fact Sheet for Healthcare Providers:  IncredibleEmployment.be  This test is no t yet approved or cleared by the [place] FDA and  has been authorized for detection and/or diagnosis of SARS-CoV-2 by FDA under an Emergency Use Authorization (EUA). This EUA will remain  in effect (meaning this test can be used) for the duration of the COVID-19 declaration under Section 564(b)(1) of the Act,  21 U.S.C.section 360bbb-3(b)(1), unless the authorization is terminated  or revoked sooner.       Influenza A by PCR NEGATIVE NEGATIVE Final   Influenza B by PCR NEGATIVE NEGATIVE Final    Comment: (NOTE) The Xpert Xpress SARS-CoV-2/FLU/RSV plus assay is intended as an aid in the diagnosis of influenza from Nasopharyngeal swab specimens and should not be used as a sole basis for treatment. Nasal washings and aspirates are unacceptable for Xpert Xpress SARS-CoV-2/FLU/RSV testing.  Fact Sheet for Patients: EntrepreneurPulse.com.au  Fact Sheet for Healthcare Providers: IncredibleEmployment.be  This test is not yet approved or cleared by the [place] FDA and has been authorized for detection and/or diagnosis of SARS-CoV-2 by FDA under an Emergency Use Authorization (EUA). This EUA will remain in effect (meaning this test can be used) for the duration of the COVID-19 declaration under Section 564(b)(1) of the Act, 21 U.S.C. section 360bbb-3(b)(1), unless the authorization is terminated or revoked.  Performed at [facility], [address] [place], [place] [address]   Blood Culture (routine x 2)     Status:  None   Collection Time: 07/10/2020  6:43 PM   Specimen: BLOOD  Result Value Ref Range Status   Specimen Description BLOOD RIGHT ANTECUBITAL  Final   Special Requests   Final    BOTTLES DRAWN AEROBIC ONLY Blood Culture adequate volume   Culture   Final    NO GROWTH 5 DAYS Performed at [facility], [address], [place], [place] [address]    Report Status 06/26/2020 FINAL  Final  Blood Culture (routine x 2)     Status: None   Collection Time: 07/18/2020  6:43 PM   Specimen: BLOOD  Result Value Ref Range Status   Specimen Description BLOOD LEFT ANTECUBITAL  Final   Special Requests   Final    BOTTLES DRAWN AEROBIC ONLY Blood Culture adequate volume   Culture  Setup Time   Final    GRAM POSITIVE RODS AEROBIC  BOTTLE ONLY CRITICAL RESULT CALLED TO, READ BACK BY AND VERIFIED WITH: MYRA FLOWERS AT 0165 ON 06/26/2020 [place]. Performed at [facility], [address] [place], [place] [address]    Culture [place]  Final   Report Status 06/26/2020 FINAL  Final  Resp Panel by RT-PCR (Flu A&B, Covid) Nasopharyngeal Swab     Status: None   Collection Time: 07/06/2020  9:39 PM   Specimen: Nasopharyngeal Swab; Nasopharyngeal(NP) swabs in vial transport medium  Result Value Ref Range Status   SARS Coronavirus 2 by RT PCR NEGATIVE NEGATIVE Final    Comment: (NOTE) SARS-CoV-2 target nucleic acids are NOT DETECTED.  The SARS-CoV-2 RNA is generally detectable in upper respiratory specimens during the acute phase of infection. The lowest concentration of SARS-CoV-2 viral copies this assay can detect is 138 copies/mL. A negative result does not preclude SARS-Cov-2 infection and should not be used as the sole basis for treatment or other patient management decisions. A negative result may occur with  improper specimen collection/handling, submission of specimen other than nasopharyngeal swab, presence of viral mutation(s) within the areas targeted by this assay, and inadequate number of viral copies(<138 copies/mL). A negative result must be combined with clinical observations, patient history, and epidemiological information. The expected result is Negative.  Fact Sheet for Patients:  EntrepreneurPulse.com.au  Fact Sheet for Healthcare Providers:  IncredibleEmployment.be  This test is no t yet approved or cleared by the [place] FDA and  has been authorized for detection and/or diagnosis of SARS-CoV-2 by FDA under an Emergency Use Authorization (EUA). This EUA will remain  in effect (meaning this test can be used) for the duration of the COVID-19 declaration under Section 564(b)(1) of the Act, 21 U.S.C.section 360bbb-3(b)(1), unless the  authorization is terminated  or revoked sooner.       Influenza A by PCR NEGATIVE NEGATIVE Final   Influenza B by PCR NEGATIVE NEGATIVE Final    Comment: (NOTE) The Xpert Xpress SARS-CoV-2/FLU/RSV plus assay is intended as an aid in the diagnosis of influenza from Nasopharyngeal swab specimens and should not be used as a sole basis for treatment. Nasal washings and aspirates are unacceptable for Xpert Xpress SARS-CoV-2/FLU/RSV testing.  Fact Sheet for Patients: EntrepreneurPulse.com.au  Fact Sheet for Healthcare Providers: IncredibleEmployment.be  This test is not yet approved or cleared by the [place] FDA and has been authorized for detection and/or diagnosis of SARS-CoV-2 by FDA under an Emergency Use Authorization (EUA). This EUA will remain in effect (meaning this test can be used) for the duration of the COVID-19 declaration under Section 564(b)(1) of  the Act, 21 U.S.C. section 360bbb-3(b)(1), unless the authorization is terminated or revoked.  Performed at [facility], [address] [place], [place] [address]   MRSA PCR Screening     Status: None   Collection Time: 06/23/20  2:45 AM   Specimen: Nasopharyngeal  Result Value Ref Range Status   MRSA by PCR NEGATIVE NEGATIVE Final    Comment:        The GeneXpert MRSA Assay (FDA approved for NASAL specimens only), is one component of a comprehensive MRSA colonization surveillance program. It is not intended to diagnose MRSA infection nor to guide or monitor treatment for MRSA infections. Performed at [facility], [address] [place], [place] [address]           IMAGING    DG Chest Port 1 View  Result Date: 06/27/2020 CLINICAL DATA:  Endotracheal intubation EXAM: PORTABLE CHEST 1 VIEW COMPARISON:  Radiograph 06/24/2020 FINDINGS: Endotracheal tube terminates 3 cm from the carina. Transesophageal tube tip and side port terminate distal to the  GE junction, beyond the margins of imaging. Telemetry leads and external support devices overlie the chest. Diffuse bilateral airspace disease with gradient attenuation in both lung bases, right greater than left as well as right fissural thickening suggestive of fluid tracking along the right fissures. No pneumothorax. Grossly stable stable cardiomediastinal contours accounting for differences in technique. The aorta is calcified. The remaining cardiomediastinal contours are unremarkable. No acute osseous or soft tissue abnormality. IMPRESSION: Endotracheal tube 3 cm from the carina. Transesophageal tube tip and side port terminate distal to the GE junction. Persistent bilateral heterogeneous opacities. Likely increase in the volume of layering pleural effusions including fluid likely tracking in the right fissures. Electronically Signed   By: Lovena Le M.D.   On: 06/27/2020 04:23     Nutrition Status: Nutrition Problem: Inadequate oral intake Etiology: inability to eat (pt sedated and ventilated) Signs/Symptoms: NPO status       Indwelling Urinary Catheter continued, requirement due to   Reason to continue Indwelling Urinary Catheter strict Intake/Output monitoring for hemodynamic instability   Central Line/ continued, requirement due to  Reason to continue [place] of central venous pressure or other hemodynamic parameters and poor IV access   Ventilator continued, requirement due to severe respiratory failure   Ventilator Sedation RASS 0 to -2      ASSESSMENT AND PLAN SYNOPSIS  Severe ACUTE Hypoxic and Hypercapnic Respiratory Failuredue to acute pulm edema due to acute sCHF and dCHF exacerbation, self extubated plan for biPAP-failed biPAP and was re intubated-worsening resp failure from severe Systolic CHF and edema  Severe ACUTE Hypoxic and Hypercapnic Respiratory Failure Continue mechanical support, bronchodilator therapy Continue SBT's  ACUTE SYSTOLIC  CARDIAC FAILURE- EF 40-45% Moderate pulmonary hypertension Continue Lasix 40 mg twice daily  END STAGE KIDNEY INJURY/Renal Failure Baseline CKD, 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 Consider head CT if she is not waking up  CARDIAC ICU monitoring   GI GI PROPHYLAXIS as indicated  NUTRITIONAL STATUS Nutrition Status: Nutrition Problem: Inadequate oral intake Etiology: inability to eat (pt sedated and ventilated) Signs/Symptoms: NPO status     DIET-->TF's as tolerated Constipation protocol as indicated  ENDO - will use ICU hypoglycemic\Hyperglycemia protocol if indicated     ELECTROLYTES -follow labs as needed -replace as needed -pharmacy consultation and following  Goals of care  Palliative care on board.  Transitioned to DNR. No escalation of care.  Multiple family members have visited with the patient over the weekend.  Possible one-way extubation today Husband Jos is the main Media planner.  DVT/GI PRX ordered and assessed TRANSFUSIONS AS NEEDED MONITOR FSBS I Assessed the need for Labs I Assessed the need for Foley I Assessed the need for Central Venous Line Family Discussion when available I Assessed the need for Mobilization I made an Assessment of medications to be adjusted accordingly Safety Risk assessment completed   CASE DISCUSSED IN [place] ICU TEAM  The patient is critically ill with multiple organ system failure and requires high complexity decision making for assessment and support, frequent evaluation and titration of therapies, advanced monitoring, review of radiographic studies and interpretation of complex data.   Critical Care Time devoted to patient care services, exclusive of separately billable procedures, described in this note is 45 minutes.   Marshell Garfinkel MD  Pulmonary and Critical Care Please see [email or web address] for pager details.  06/27/2020, 8:28 AM

## 2020-06-27 NOTE — Progress Notes (Signed)
[facility]  ROUNDING NOTE   Subjective:     Remains critically ill Multiple family members at bedside FiO2 28%  01/08 0701 - 01/09 0700 In: 1747.5 [I.V.:742.6; NG/GT:445; IV Piggyback:559.9] Out: 1000 [Urine:1000]   Objective:  Vital signs in last 24 hours:  Temp:  [98.06 F (36.7 C)-100.58 F (38.1 C)] 99.5 F (37.5 C) (01/09 0900) Pulse Rate:  [73-123] 73 (01/09 0800) Resp:  [0-29] 15 (01/09 0000) BP: (79-147)/(52-76) 128/61 (01/09 0900) SpO2:  [97 %-100 %] 100 % (01/09 0800) FiO2 (%):  [28 %] 28 % (01/09 0718) Weight:  [63.7 kg] 63.7 kg (01/09 0314)  Weight change: 6.7 kg Filed Weights   06/25/20 0325 06/26/20 0353 06/27/20 0314  Weight: 57.2 kg 57 kg 63.7 kg    Intake/Output: I/O last 3 completed shifts: In: 2647.5 [I.V.:1087.3; NG/GT:940; IV Piggyback:620.2] Out: 1465 [Urine:1465]   Intake/Output this shift:  Total I/O In: 199.5 [I.V.:199.5] Out: 475 [Urine:475]   Physical Exam: General:  No acute distress, laying in the bed  HEENT  anicteric, moist oral mucous membrane  Pulm/lungs  ET tube in place, ventilator assisted  CVS/Heart  regular rhythm, no rub or gallop  Abdomen:   Soft, nontender  Extremities:  Trace peripheral edema  Neurologic:  Sedated  Skin:  No acute rashes  Right femoral temporary dialysis catheter placed 1/4 Dr. Delana Meyer     Basic Metabolic Panel: Recent Labs  Lab 07/11/2020 0223 06/23/20 0559 06/24/20 0326 06/25/20 0629 06/26/20 0500 06/27/20 0319  NA 134* 138 137 140 140 138  K 4.9 4.4 3.9 3.7 3.4* 3.9  CL 97* 102 100 102 102 101  CO2 24 20* _0 GLUCOSE 207* 96 206* 177* 143* 176*  BUN 80* 63* 46* 37* 62* 81*  CREATININE 6.60* 5.44* 3.68* 2.62* 3.72* 4.37*  CALCIUM 8.6* 8.3* 7.9* 8.0* 7.5* 7.1*  MG 2.9*  --  2.2 2.1 1.9 1.9  PHOS 6.0*  --  5.2* 3.5 1.7* 3.7    Liver Function Tests: Recent Labs  Lab 07/02/2020 1454 06/23/2020 0223  AST 31 28  ALT 38 37  ALKPHOS 121 131*  BILITOT 0.8 0.9   PROT 6.9 7.3  ALBUMIN 3.3* 3.4*   No results for input(s): LIPASE, AMYLASE in the last 168 hours. No results for input(s): AMMONIA in the last 168 hours.  CBC: Recent Labs  Lab 07/19/2020 0223 07/02/2020 2130 06/23/20 0559 06/24/20 0326 06/25/20 0629 06/26/20 0500 06/27/20 0319  WBC 12.4*   < > 14.1* 15.9* 18.3* 17.5* 17.9*  NEUTROABS 11.4*  --   --   --   --   --   --   HGB 10.1*   < > 8.6* 7.8* 8.2* 8.3* 7.5*  HCT 30.6*   < > 27.4* 24.1* 25.9* 25.7* 23.5*  MCV 93.9   < > 97.9 94.9 95.9 95.5 94.0  PLT 399   < > 278 253 277 263 214   < > = values in this interval not displayed.    Cardiac Enzymes: No results for input(s): CKTOTAL, CKMB, CKMBINDEX, TROPONINI in the last 168 hours.  BNP: Invalid input(s): POCBNP  CBG: Recent Labs  Lab 06/26/20 1633 06/26/20 1921 06/26/20 2314 06/27/20 0251 06/27/20 0751  GLUCAP 209* 133* 156* 161* 140*    Microbiology: Results for orders placed or performed during the hospital encounter of 07/03/2020  Resp Panel by RT-PCR (Flu A&B, Covid) Nasopharyngeal Swab     Status: None   Collection Time: 07/09/2020  6:43  PM   Specimen: Nasopharyngeal Swab; Nasopharyngeal(NP) swabs in vial transport medium  Result Value Ref Range Status   SARS Coronavirus 2 by RT PCR NEGATIVE NEGATIVE Final    Comment: (NOTE) SARS-CoV-2 target nucleic acids are NOT DETECTED.  The SARS-CoV-2 RNA is generally detectable in upper respiratory specimens during the acute phase of infection. The lowest concentration of SARS-CoV-2 viral copies this assay can detect is 138 copies/mL. A negative result does not preclude SARS-Cov-2 infection and should not be used as the sole basis for treatment or other patient management decisions. A negative result may occur with  improper specimen collection/handling, submission of specimen other than nasopharyngeal swab, presence of viral mutation(s) within the areas targeted by this assay, and inadequate number of  viral copies(<138 copies/mL). A negative result must be combined with clinical observations, patient history, and epidemiological information. The expected result is Negative.  Fact Sheet for Patients:  EntrepreneurPulse.com.au  Fact Sheet for Healthcare Providers:  IncredibleEmployment.be  This test is no t yet approved or cleared by the [place] FDA and  has been authorized for detection and/or diagnosis of SARS-CoV-2 by FDA under an Emergency Use Authorization (EUA). This EUA will remain  in effect (meaning this test can be used) for the duration of the COVID-19 declaration under Section 564(b)(1) of the Act, 21 U.S.C.section 360bbb-3(b)(1), unless the authorization is terminated  or revoked sooner.       Influenza A by PCR NEGATIVE NEGATIVE Final   Influenza B by PCR NEGATIVE NEGATIVE Final    Comment: (NOTE) The Xpert Xpress SARS-CoV-2/FLU/RSV plus assay is intended as an aid in the diagnosis of influenza from Nasopharyngeal swab specimens and should not be used as a sole basis for treatment. Nasal washings and aspirates are unacceptable for Xpert Xpress SARS-CoV-2/FLU/RSV testing.  Fact Sheet for Patients: EntrepreneurPulse.com.au  Fact Sheet for Healthcare Providers: IncredibleEmployment.be  This test is not yet approved or cleared by the [place] FDA and has been authorized for detection and/or diagnosis of SARS-CoV-2 by FDA under an Emergency Use Authorization (EUA). This EUA will remain in effect (meaning this test can be used) for the duration of the COVID-19 declaration under Section 564(b)(1) of the Act, 21 U.S.C. section 360bbb-3(b)(1), unless the authorization is terminated or revoked.  Performed at [facility], [address] [place], [place] [address]   Blood Culture (routine x 2)     Status: None   Collection Time: 06/27/2020  6:43 PM   Specimen: BLOOD  Result  Value Ref Range Status   Specimen Description BLOOD RIGHT ANTECUBITAL  Final   Special Requests   Final    BOTTLES DRAWN AEROBIC ONLY Blood Culture adequate volume   Culture   Final    NO GROWTH 5 DAYS Performed at [facility], [address] [place], [place] [address]    Report Status 06/26/2020 FINAL  Final  Blood Culture (routine x 2)     Status: None (Preliminary result)   Collection Time: 07/01/2020  6:43 PM   Specimen: BLOOD  Result Value Ref Range Status   Specimen Description   Final    BLOOD LEFT ANTECUBITAL Performed at [facility], [address], [place], [place] [address]    Special Requests   Final    BOTTLES DRAWN AEROBIC ONLY Blood Culture adequate volume Performed at [facility], [address] [place],  [address]    Culture  Setup Time   Final    GRAM POSITIVE RODS AEROBIC BOTTLE  ONLY CRITICAL RESULT CALLED TO, READ BACK BY AND VERIFIED WITH: MYRA FLOWERS AT 1324 ON 06/26/2020 Lula. Performed at [facility], [address] [place], [place] [address]    Culture   Final    Lonell Grandchild POSITIVE RODS TOO YOUNG TO READ Performed at [facility], [place] [address], [place], [place] [address]    Report Status PENDING  Incomplete  Resp Panel by RT-PCR (Flu A&B, Covid) Nasopharyngeal Swab     Status: None   Collection Time: 07/07/2020  9:39 PM   Specimen: Nasopharyngeal Swab; Nasopharyngeal(NP) swabs in vial transport medium  Result Value Ref Range Status   SARS Coronavirus 2 by RT PCR NEGATIVE NEGATIVE Final    Comment: (NOTE) SARS-CoV-2 target nucleic acids are NOT DETECTED.  The SARS-CoV-2 RNA is generally detectable in upper respiratory specimens during the acute phase of infection. The lowest concentration of SARS-CoV-2 viral copies this assay can detect is 138 copies/mL. A negative result does not preclude SARS-Cov-2 infection and should not be used as the sole basis for treatment or other patient management  decisions. A negative result may occur with  improper specimen collection/handling, submission of specimen other than nasopharyngeal swab, presence of viral mutation(s) within the areas targeted by this assay, and inadequate number of viral copies(<138 copies/mL). A negative result must be combined with clinical observations, patient history, and epidemiological information. The expected result is Negative.  Fact Sheet for Patients:  EntrepreneurPulse.com.au  Fact Sheet for Healthcare Providers:  IncredibleEmployment.be  This test is no t yet approved or cleared by the [place] FDA and  has been authorized for detection and/or diagnosis of SARS-CoV-2 by FDA under an Emergency Use Authorization (EUA). This EUA will remain  in effect (meaning this test can be used) for the duration of the COVID-19 declaration under Section 564(b)(1) of the Act, 21 U.S.C.section 360bbb-3(b)(1), unless the authorization is terminated  or revoked sooner.       Influenza A by PCR NEGATIVE NEGATIVE Final   Influenza B by PCR NEGATIVE NEGATIVE Final    Comment: (NOTE) The Xpert Xpress SARS-CoV-2/FLU/RSV plus assay is intended as an aid in the diagnosis of influenza from Nasopharyngeal swab specimens and should not be used as a sole basis for treatment. Nasal washings and aspirates are unacceptable for Xpert Xpress SARS-CoV-2/FLU/RSV testing.  Fact Sheet for Patients: EntrepreneurPulse.com.au  Fact Sheet for Healthcare Providers: IncredibleEmployment.be  This test is not yet approved or cleared by the [place] FDA and has been authorized for detection and/or diagnosis of SARS-CoV-2 by FDA under an Emergency Use Authorization (EUA). This EUA will remain in effect (meaning this test can be used) for the duration of the COVID-19 declaration under Section 564(b)(1) of the Act, 21 U.S.C. section 360bbb-3(b)(1), unless the  authorization is terminated or revoked.  Performed at [facility], [address] [place], [place] [address]   MRSA PCR Screening     Status: None   Collection Time: 06/23/20  2:45 AM   Specimen: Nasopharyngeal  Result Value Ref Range Status   MRSA by PCR NEGATIVE NEGATIVE Final    Comment:        The GeneXpert MRSA Assay (FDA approved for NASAL specimens only), is one component of a comprehensive MRSA colonization surveillance program. It is not intended to diagnose MRSA infection nor to guide or monitor treatment for MRSA infections. Performed at [facility], [address], [place], [place] [address]     Coagulation Studies: Recent Labs  06/26/20 2045  LABPROT 14.2  INR 1.1    Urinalysis: No results for input(s): COLORURINE, LABSPEC, PHURINE, GLUCOSEU, HGBUR, BILIRUBINUR, KETONESUR, PROTEINUR, UROBILINOGEN, NITRITE, LEUKOCYTESUR in the last 72 hours.  Invalid input(s): APPERANCEUR    Imaging: DG Chest Port 1 View  Result Date: 06/27/2020 CLINICAL DATA:  Endotracheal intubation EXAM: PORTABLE CHEST 1 VIEW COMPARISON:  Radiograph 06/24/2020 FINDINGS: Endotracheal tube terminates 3 cm from the carina. Transesophageal tube tip and side port terminate distal to the GE junction, beyond the margins of imaging. Telemetry leads and external support devices overlie the chest. Diffuse bilateral airspace disease with gradient attenuation in both lung bases, right greater than left as well as right fissural thickening suggestive of fluid tracking along the right fissures. No pneumothorax. Grossly stable stable cardiomediastinal contours accounting for differences in technique. The aorta is calcified. The remaining cardiomediastinal contours are unremarkable. No acute osseous or soft tissue abnormality. IMPRESSION: Endotracheal tube 3 cm from the carina. Transesophageal tube tip and side port terminate distal to the GE junction. Persistent bilateral  heterogeneous opacities. Likely increase in the volume of layering pleural effusions including fluid likely tracking in the right fissures. Electronically Signed   By: Lovena Le M.D.   On: 06/27/2020 04:23     Medications:   . amiodarone 30 mg/hr (06/27/20 0755)  . famotidine (PEPCID) IV Stopped (06/26/20 2027)  . feeding supplement (VITAL AF 1.2 CAL) 1,000 mL (06/26/20 1022)  . fentaNYL infusion INTRAVENOUS 150 mcg/hr (06/27/20 0754)  . heparin 900 Units/hr (06/27/20 0754)  . propofol (DIPRIVAN) infusion 20 mcg/kg/min (06/27/20 0549)   . aspirin  81 mg Per Tube Daily  . chlorhexidine gluconate (MEDLINE KIT)  15 mL Mouth Rinse BID  . Chlorhexidine Gluconate Cloth  6 each Topical Q0600  . docusate  100 mg Per Tube BID  . epoetin (EPOGEN/PROCRIT) injection  10,000 Units Intravenous Q T,Th,Sa-HD  . feeding supplement (PROSource TF)  45 mL Per Tube Daily  . furosemide  40 mg Intravenous BID  . hydrALAZINE  25 mg Per Tube BID  . insulin aspart  0-20 Units Subcutaneous Q4H  . mouth rinse  15 mL Mouth Rinse 10 times per day  . multivitamin  15 mL Per Tube Daily  . polyethylene glycol  17 g Per Tube Daily   acetaminophen **OR** acetaminophen, fentaNYL, haloperidol lactate, ipratropium-albuterol, labetalol, LORazepam, ondansetron (ZOFRAN) IV, vecuronium  Assessment/ Plan:  Ms. Stephanie Casey is a 65 y.o. 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   1. End stage renal disease from progression of chronic kidney disease secondary to nephrotic syndrome from FSGS collapsing variant biopsy 03/2020.  Followed by [facility].  Outpatient baseline creatinine of 4.75/GFR 9 in October 2021  Completed 3 dialysis treatments.   Urine output appears to have improved Electrolytes and volume status are acceptable.  No acute indication for dialysis at present Currently getting IV furosemide 40 mg twice a day Evaluate daily for dialysis need  2. Acute respiratory failure with pulmonary edema requring intubation and mechanical ventilation.  Ventilator assisted, FiO2 28% Failure to wean  3. Anemia with chronic kidney disease:  Lab Results  Component Value Date   HGB 7.5 (L) 06/27/2020    - EPO with dialysis   LOS: 6 Lilias Lorensen 1/9/202210:34 AM

## 2020-06-28 DIAGNOSIS — N171 Acute kidney failure with acute cortical necrosis: Secondary | ICD-10-CM

## 2020-06-28 DIAGNOSIS — I5021 Acute systolic (congestive) heart failure: Secondary | ICD-10-CM

## 2020-06-28 LAB — BASIC METABOLIC PANEL
Anion gap: 18 — ABNORMAL HIGH (ref 5–15)
BUN: 96 mg/dL — ABNORMAL HIGH (ref 8–23)
CO2: 24 mmol/L (ref 22–32)
Calcium: 7.4 mg/dL — ABNORMAL LOW (ref 8.9–10.3)
Chloride: 96 mmol/L — ABNORMAL LOW (ref 98–111)
Creatinine, Ser: 4.75 mg/dL — ABNORMAL HIGH (ref 0.44–1.00)
GFR, Estimated: 10 mL/min — ABNORMAL LOW (ref >60)
Glucose, Bld: 182 mg/dL — ABNORMAL HIGH (ref 70–99)
Potassium: 3.9 mmol/L (ref 3.5–5.1)
Sodium: 138 mmol/L (ref 135–145)

## 2020-06-28 LAB — CBC
HCT: 23.5 % — ABNORMAL LOW (ref 36.0–46.0)
Hemoglobin: 7.9 g/dL — ABNORMAL LOW (ref 12.0–15.0)
MCH: 30.6 pg (ref 26.0–34.0)
MCHC: 33.6 g/dL (ref 30.0–36.0)
MCV: 91.1 fL (ref 80.0–100.0)
Platelets: 231 10*3/uL (ref 150–400)
RBC: 2.58 MIL/uL — ABNORMAL LOW (ref 3.87–5.11)
RDW: 14.7 % (ref 11.5–15.5)
WBC: 17.7 10*3/uL — ABNORMAL HIGH (ref 4.0–10.5)
nRBC: 0 % (ref 0.0–0.2)

## 2020-06-28 LAB — HEPARIN LEVEL (UNFRACTIONATED)
Heparin Unfractionated: 0.16 IU/mL — ABNORMAL LOW (ref 0.30–0.70)
Heparin Unfractionated: 0.25 IU/mL — ABNORMAL LOW (ref 0.30–0.70)
Heparin Unfractionated: 0.17 IU/mL — ABNORMAL LOW (ref 0.30–0.70)

## 2020-06-28 LAB — GLUCOSE, CAPILLARY
Glucose-Capillary: 172 mg/dL — ABNORMAL HIGH (ref 70–99)
Glucose-Capillary: 168 mg/dL — ABNORMAL HIGH (ref 70–99)
Glucose-Capillary: 204 mg/dL — ABNORMAL HIGH (ref 70–99)
Glucose-Capillary: 168 mg/dL — ABNORMAL HIGH (ref 70–99)
Glucose-Capillary: 157 mg/dL — ABNORMAL HIGH (ref 70–99)

## 2020-06-28 LAB — PHOSPHORUS: Phosphorus: 4.9 mg/dL — ABNORMAL HIGH (ref 2.5–4.6)

## 2020-06-28 LAB — MAGNESIUM: Magnesium: 2 mg/dL (ref 1.7–2.4)

## 2020-06-28 LAB — TRIGLYCERIDES: Triglycerides: 257 mg/dL — ABNORMAL HIGH (ref <150)

## 2020-06-28 MED ORDER — HEPARIN (PORCINE) 25000 UT/250ML-% IV SOLN
1700.0000 [IU]/h | INTRAVENOUS | Status: DC
  Administered 2020-06-28: 1200 [IU]/h via INTRAVENOUS
  Administered 2020-06-28: 1350 [IU]/h via INTRAVENOUS
  Administered 2020-06-29 – 2020-06-30 (×2): 1550 [IU]/h via INTRAVENOUS
  Filled 2020-06-28 (×4): qty 250

## 2020-06-28 MED ORDER — HEPARIN BOLUS VIA INFUSION
800.0000 [IU] | Freq: Once | INTRAVENOUS | Status: AC
  Administered 2020-06-28: 800 [IU] via INTRAVENOUS
  Filled 2020-06-28: qty 800

## 2020-06-28 MED ORDER — HEPARIN BOLUS VIA INFUSION
1700.0000 [IU] | Freq: Once | INTRAVENOUS | Status: AC
  Administered 2020-06-28: 1700 [IU] via INTRAVENOUS
  Filled 2020-06-28: qty 1700

## 2020-06-28 NOTE — Progress Notes (Signed)
[facility]  ROUNDING NOTE   Subjective:     Remains critically ill Multiple family members at bedside Vent assisted. FiO2 28% Amiodarone Propofol Fentanyl GI: TF @45  cc/hr  01/09 0701 - 01/10 0700 In: 1674.9 [I.V.:1100; NG/GT:525; IV Piggyback:49.9] Out: 1675 [Urine:1675]   Objective:  Vital signs in last 24 hours:  Temp:  [99 F (37.2 C)-100.58 F (38.1 C)] 100.58 F (38.1 C) (01/10 0900) Pulse Rate:  [74-91] 83 (01/10 0900) Resp:  [17-20] 20 (01/10 0800) BP: (117-143)/(55-76) 122/58 (01/10 0900) SpO2:  [94 %-100 %] 100 % (01/10 0900) FiO2 (%):  [28 %] 28 % (01/10 0800) Weight:  [64.1 kg] 64.1 kg (01/10 0407)  Weight change: 0.4 kg Filed Weights   06/26/20 0353 06/27/20 0314 06/28/20 0407  Weight: 57 kg 63.7 kg 64.1 kg    Intake/Output: I/O last 3 completed shifts: In: 2714 [I.V.:1644.1; NG/GT:970; IV Piggyback:99.9] Out: 1975 [Urine:1975]   Intake/Output this shift:  Total I/O In: 91.4 [I.V.:91.4] Out: 100 [Urine:100]   Physical Exam: General:  No acute distress, laying in the bed  HEENT  anicteric, moist oral mucous membrane  Pulm/lungs  ET tube in place, ventilator assisted  CVS/Heart  regular rhythm, no rub or gallop  Abdomen:   Soft, nontender  Extremities:  Trace peripheral edema  Neurologic:  Sedated  Skin:  No acute rashes  Right femoral temporary dialysis catheter placed 1/4 Dr. Delana Meyer     Basic Metabolic Panel: Recent Labs  Lab 06/24/20 0326 06/25/20 0629 06/26/20 0500 06/27/20 0319 06/28/20 0245  NA 137 140 140 138 138  K 3.9 3.7 3.4* 3.9 3.9  CL 100 102 102 101 96*  CO2 24 26 25 24 24   GLUCOSE 206* 177* 143* 176* 182*  BUN 46* 37* 62* 81* 96*  CREATININE 3.68* 2.62* 3.72* 4.37* 4.75*  CALCIUM 7.9* 8.0* 7.5* 7.1* 7.4*  MG 2.2 2.1 1.9 1.9 2.0  PHOS 5.2* 3.5 1.7* 3.7 4.9*    Liver Function Tests: Recent Labs  Lab 07/03/2020 1454 07/15/2020 0223  AST 31 28  ALT 38 37  ALKPHOS 121 131*  BILITOT 0.8 0.9   PROT 6.9 7.3  ALBUMIN 3.3* 3.4*   No results for input(s): LIPASE, AMYLASE in the last 168 hours. No results for input(s): AMMONIA in the last 168 hours.  CBC: Recent Labs  Lab 06/19/2020 0223 06/24/2020 2130 06/24/20 0326 06/25/20 0629 06/26/20 0500 06/27/20 0319 06/28/20 0245  WBC 12.4*   < > 15.9* 18.3* 17.5* 17.9* 17.7*  NEUTROABS 11.4*  --   --   --   --   --   --   HGB 10.1*   < > 7.8* 8.2* 8.3* 7.5* 7.9*  HCT 30.6*   < > 24.1* 25.9* 25.7* 23.5* 23.5*  MCV 93.9   < > 94.9 95.9 95.5 94.0 91.1  PLT 399   < > 253 277 263 214 231   < > = values in this interval not displayed.    Cardiac Enzymes: No results for input(s): CKTOTAL, CKMB, CKMBINDEX, TROPONINI in the last 168 hours.  BNP: Invalid input(s): POCBNP  CBG: Recent Labs  Lab 06/27/20 1641 06/27/20 1922 06/27/20 2336 06/28/20 0343 06/28/20 0823  GLUCAP 169* 173* 156* 172* 168*    Microbiology: Results for orders placed or performed during the hospital encounter of 06/28/2020  Resp Panel by RT-PCR (Flu A&B, Covid) Nasopharyngeal Swab     Status: None   Collection Time: 06/20/2020  6:43 PM   Specimen: Nasopharyngeal Swab; Nasopharyngeal(NP)  swabs in vial transport medium  Result Value Ref Range Status   SARS Coronavirus 2 by RT PCR NEGATIVE NEGATIVE Final    Comment: (NOTE) SARS-CoV-2 target nucleic acids are NOT DETECTED.  The SARS-CoV-2 RNA is generally detectable in upper respiratory specimens during the acute phase of infection. The lowest concentration of SARS-CoV-2 viral copies this assay can detect is 138 copies/mL. A negative result does not preclude SARS-Cov-2 infection and should not be used as the sole basis for treatment or other patient management decisions. A negative result may occur with  improper specimen collection/handling, submission of specimen other than nasopharyngeal swab, presence of viral mutation(s) within the areas targeted by this assay, and inadequate number of  viral copies(<138 copies/mL). A negative result must be combined with clinical observations, patient history, and epidemiological information. The expected result is Negative.  Fact Sheet for Patients:  EntrepreneurPulse.com.au  Fact Sheet for Healthcare Providers:  IncredibleEmployment.be  This test is no t yet approved or cleared by the [place] FDA and  has been authorized for detection and/or diagnosis of SARS-CoV-2 by FDA under an Emergency Use Authorization (EUA). This EUA will remain  in effect (meaning this test can be used) for the duration of the COVID-19 declaration under Section 564(b)(1) of the Act, 21 U.S.C.section 360bbb-3(b)(1), unless the authorization is terminated  or revoked sooner.       Influenza A by PCR NEGATIVE NEGATIVE Final   Influenza B by PCR NEGATIVE NEGATIVE Final    Comment: (NOTE) The Xpert Xpress SARS-CoV-2/FLU/RSV plus assay is intended as an aid in the diagnosis of influenza from Nasopharyngeal swab specimens and should not be used as a sole basis for treatment. Nasal washings and aspirates are unacceptable for Xpert Xpress SARS-CoV-2/FLU/RSV testing.  Fact Sheet for Patients: EntrepreneurPulse.com.au  Fact Sheet for Healthcare Providers: IncredibleEmployment.be  This test is not yet approved or cleared by the [place] FDA and has been authorized for detection and/or diagnosis of SARS-CoV-2 by FDA under an Emergency Use Authorization (EUA). This EUA will remain in effect (meaning this test can be used) for the duration of the COVID-19 declaration under Section 564(b)(1) of the Act, 21 U.S.C. section 360bbb-3(b)(1), unless the authorization is terminated or revoked.  Performed at [facility], [address] [place], [place] [address]   Blood Culture (routine x 2)     Status: None   Collection Time: 07/08/2020  6:43 PM   Specimen: BLOOD  Result  Value Ref Range Status   Specimen Description BLOOD RIGHT ANTECUBITAL  Final   Special Requests   Final    BOTTLES DRAWN AEROBIC ONLY Blood Culture adequate volume   Culture   Final    NO GROWTH 5 DAYS Performed at [facility], [address], [place], [place] [address]    Report Status 06/26/2020 FINAL  Final  Blood Culture (routine x 2)     Status: None (Preliminary result)   Collection Time: 07/09/2020  6:43 PM   Specimen: BLOOD  Result Value Ref Range Status   Specimen Description   Final    BLOOD LEFT ANTECUBITAL Performed at [facility], [address] [place], [place] [address]    Special Requests   Final    BOTTLES DRAWN AEROBIC ONLY Blood Culture adequate volume Performed at [address] [place], [place] [address]    Culture  Setup Time   Final    GRAM POSITIVE RODS AEROBIC BOTTLE ONLY CRITICAL RESULT CALLED TO, READ BACK  BY AND VERIFIED WITH: MYRA FLOWERS AT 2620 ON 06/26/2020 [place]. Performed at [facility], [address] [place], [place] [address]    Culture GRAM POSITIVE RODS  Final   Report Status PENDING  Incomplete  Resp Panel by RT-PCR (Flu A&B, Covid) Nasopharyngeal Swab     Status: None   Collection Time: 07/17/2020  9:39 PM   Specimen: Nasopharyngeal Swab; Nasopharyngeal(NP) swabs in vial transport medium  Result Value Ref Range Status   SARS Coronavirus 2 by RT PCR NEGATIVE NEGATIVE Final    Comment: (NOTE) SARS-CoV-2 target nucleic acids are NOT DETECTED.  The SARS-CoV-2 RNA is generally detectable in upper respiratory specimens during the acute phase of infection. The lowest concentration of SARS-CoV-2 viral copies this assay can detect is 138 copies/mL. A negative result does not preclude SARS-Cov-2 infection and should not be used as the sole basis for treatment or other patient management decisions. A negative result may occur with  improper specimen collection/handling, submission of specimen  other than nasopharyngeal swab, presence of viral mutation(s) within the areas targeted by this assay, and inadequate number of viral copies(<138 copies/mL). A negative result must be combined with clinical observations, patient history, and epidemiological information. The expected result is Negative.  Fact Sheet for Patients:  EntrepreneurPulse.com.au  Fact Sheet for Healthcare Providers:  IncredibleEmployment.be  This test is no t yet approved or cleared by the [place] FDA and  has been authorized for detection and/or diagnosis of SARS-CoV-2 by FDA under an Emergency Use Authorization (EUA). This EUA will remain  in effect (meaning this test can be used) for the duration of the COVID-19 declaration under Section 564(b)(1) of the Act, 21 U.S.C.section 360bbb-3(b)(1), unless the authorization is terminated  or revoked sooner.       Influenza A by PCR NEGATIVE NEGATIVE Final   Influenza B by PCR NEGATIVE NEGATIVE Final    Comment: (NOTE) The Xpert Xpress SARS-CoV-2/FLU/RSV plus assay is intended as an aid in the diagnosis of influenza from Nasopharyngeal swab specimens and should not be used as a sole basis for treatment. Nasal washings and aspirates are unacceptable for Xpert Xpress SARS-CoV-2/FLU/RSV testing.  Fact Sheet for Patients: EntrepreneurPulse.com.au  Fact Sheet for Healthcare Providers: IncredibleEmployment.be  This test is not yet approved or cleared by the [place] FDA and has been authorized for detection and/or diagnosis of SARS-CoV-2 by FDA under an Emergency Use Authorization (EUA). This EUA will remain in effect (meaning this test can be used) for the duration of the COVID-19 declaration under Section 564(b)(1) of the Act, 21 U.S.C. section 360bbb-3(b)(1), unless the authorization is terminated or revoked.  Performed at [facility], [address]  [place], [place] [address]   MRSA PCR Screening     Status: None   Collection Time: 06/23/20  2:45 AM   Specimen: Nasopharyngeal  Result Value Ref Range Status   MRSA by PCR NEGATIVE NEGATIVE Final    Comment:        The GeneXpert MRSA Assay (FDA approved for NASAL specimens only), is one component of a comprehensive MRSA colonization surveillance program. It is not intended to diagnose MRSA infection nor to guide or monitor treatment for MRSA infections. Performed at [facility], [address] [place], [place] [address]     Coagulation Studies: Recent Labs    06/26/20 2043-09-13  LABPROT 14.2  INR 1.1    Urinalysis: No results for input(s): COLORURINE, LABSPEC, PHURINE, GLUCOSEU, HGBUR, BILIRUBINUR, KETONESUR, PROTEINUR, UROBILINOGEN, NITRITE, LEUKOCYTESUR in  the last 72 hours.  Invalid input(s): APPERANCEUR    Imaging: DG Chest Port 1 View  Result Date: 06/27/2020 CLINICAL DATA:  Endotracheal intubation EXAM: PORTABLE CHEST 1 VIEW COMPARISON:  Radiograph 06/24/2020 FINDINGS: Endotracheal tube terminates 3 cm from the carina. Transesophageal tube tip and side port terminate distal to the GE junction, beyond the margins of imaging. Telemetry leads and external support devices overlie the chest. Diffuse bilateral airspace disease with gradient attenuation in both lung bases, right greater than left as well as right fissural thickening suggestive of fluid tracking along the right fissures. No pneumothorax. Grossly stable stable cardiomediastinal contours accounting for differences in technique. The aorta is calcified. The remaining cardiomediastinal contours are unremarkable. No acute osseous or soft tissue abnormality. IMPRESSION: Endotracheal tube 3 cm from the carina. Transesophageal tube tip and side port terminate distal to the GE junction. Persistent bilateral heterogeneous opacities. Likely increase in the volume of layering pleural effusions including fluid likely  tracking in the right fissures. Electronically Signed   By: Lovena Le M.D.   On: 06/27/2020 04:23     Medications:   . amiodarone 30 mg/hr (06/28/20 0947)  . famotidine (PEPCID) IV Stopped (06/27/20 2209)  . feeding supplement (VITAL AF 1.2 CAL) 1,000 mL (06/26/20 1022)  . fentaNYL infusion INTRAVENOUS 125 mcg/hr (06/28/20 0800)  . heparin 1,200 Units/hr (06/28/20 0800)  . propofol (DIPRIVAN) infusion 10 mcg/kg/min (06/28/20 0800)   . aspirin  81 mg Per Tube Daily  . chlorhexidine gluconate (MEDLINE KIT)  15 mL Mouth Rinse BID  . Chlorhexidine Gluconate Cloth  6 each Topical Q0600  . docusate  100 mg Per Tube BID  . epoetin (EPOGEN/PROCRIT) injection  10,000 Units Intravenous Q T,Th,Sa-HD  . feeding supplement (PROSource TF)  45 mL Per Tube Daily  . furosemide  40 mg Intravenous BID  . hydrALAZINE  25 mg Per Tube BID  . insulin aspart  0-20 Units Subcutaneous Q4H  . mouth rinse  15 mL Mouth Rinse 10 times per day  . multivitamin  15 mL Per Tube Daily  . polyethylene glycol  17 g Per Tube Daily   acetaminophen **OR** acetaminophen, fentaNYL, haloperidol lactate, ipratropium-albuterol, labetalol, LORazepam, ondansetron (ZOFRAN) IV, vecuronium  Assessment/ Plan:  Stephanie Casey is a 65 y.o. 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   1. End stage renal disease from progression of chronic kidney disease secondary to nephrotic syndrome from FSGS collapsing variant biopsy 03/2020.  Followed by [facility].  Outpatient baseline creatinine of 4.75/GFR 9 in October 2021  Completed 3 dialysis treatments.  BUN/Cr are elevated Plan for HD today  2. Acute respiratory failure with pulmonary edema requring  intubation and mechanical ventilation.  Ventilator assisted, FiO2 28% Failure to wean  3. Anemia with chronic kidney disease:  Lab Results  Component Value Date   HGB 7.9 (L) 06/28/2020    - EPO with dialysis   LOS: 7 Caryl Manas 1/10/202211:10 AM

## 2020-06-28 NOTE — Progress Notes (Signed)
CRITICAL CARE NOTE  65 y.o. 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 Failed trial of BiPAP in ED requiring emergent intubation.  1/4: Right Femoral Trialysis placed by Vascular Surgery  Significant Diagnostic Tests:  1/3: CXR>>Moderate to marked severity predominantly bilateral perihilar, bilateral suprahilar and bilateral infrahilar infiltrates are seen. Extension to involve the bilateral lung bases is also noted. There are small bilateral pleural effusions. No pneumothorax is identified. The cardiac silhouette is moderately enlarged. The visualized skeletal structures are unremarkable. 1/3: Renal US>>Atrophic and echogenic bilateral kidneys suggesting renal parenchymal disease with an underlying mass within the left kidney not excluded. Recommend MRI or CT renal protocol for further evaluation.  Micro Data:  1/3: SARS-CoV-2 PCR>>negative 1/3: Influenza A&B PCR>>negative 1/3: Blood culture x2>> 1/4: HIV Screen>>nonreactive 1/4: Hepatitis B Surface Ag>>nonreactive 1/4:Hepatitis B S ab>>nonreactive 1/4: Hepatitis B core Ab, IgM>>nonreactive 1/4: Hepatitis C Ab>>nonreactive      1/4 admitted to ICU for acute pulm edema 1/5 remains on vent did NOT tolerate HD due to hypotension 1/6 self extubated-on biPAP 1/6 re intubated emergently 1/7 Met with palliative and made DNR 1/10 DNR, patient with severe heart failure,+renal failure       CC  follow up respiratory failure  SUBJECTIVE Patient remains critically ill Prognosis is guarded +multiorgan failure  BP 122/60   Pulse 74   Temp 100.2 F (37.9 C)   Resp 19   Ht 5' 0.98" (1.549 m)   Wt 64.1 kg   SpO2 99%   BMI 26.72 kg/m    I/O last 3 completed shifts: In: 2714 [I.V.:1644.1; NG/GT:970; IV Piggyback:99.9] Out: 1975 [Urine:1975] No intake/output data recorded.  SpO2: 99 % O2 Flow Rate (L/min): 15  L/min FiO2 (%): 28 %  Estimated body mass index is 26.72 kg/m as calculated from the following:   Height as of this encounter: 5' 0.98" (1.549 m).   Weight as of this encounter: 64.1 kg.  SIGNIFICANT EVENTS   REVIEW OF SYSTEMS  PATIENT IS UNABLE TO PROVIDE COMPLETE REVIEW OF SYSTEMS DUE TO SEVERE CRITICAL ILLNESS        PHYSICAL EXAMINATION:  GENERAL:critically ill appearing, +resp distress HEAD: Normocephalic, atraumatic.  EYES: Pupils equal, round, reactive to light.  No scleral icterus.  MOUTH: Moist mucosal membrane. NECK: Supple.  PULMONARY: +rhonchi, +wheezing CARDIOVASCULAR: S1 and S2. Regular rate and rhythm. No murmurs, rubs, or gallops.  GASTROINTESTINAL: Soft, nontender, -distended.  Positive bowel sounds.   MUSCULOSKELETAL: +edema.  NEUROLOGIC: obtunded, GCS<8 SKIN:intact,warm,dry  MEDICATIONS: I have reviewed all medications and confirmed regimen as documented   CULTURE RESULTS   Recent Results (from the past 240 hour(s))  Resp Panel by RT-PCR (Flu A&B, Covid) Nasopharyngeal Swab     Status: None   Collection Time: 07/07/2020  6:43 PM   Specimen: Nasopharyngeal Swab; Nasopharyngeal(NP) swabs in vial transport medium  Result Value Ref Range Status   SARS Coronavirus 2 by RT PCR NEGATIVE NEGATIVE Final    Comment: (NOTE) SARS-CoV-2 target nucleic acids are NOT DETECTED.  The SARS-CoV-2 RNA is generally detectable in upper respiratory specimens during the acute phase of infection. The lowest concentration of SARS-CoV-2 viral copies this assay can detect is 138 copies/mL. A negative result does not preclude SARS-Cov-2 infection and should not be used as the sole basis for treatment or other patient management decisions. A negative result may occur with  improper specimen collection/handling, submission of specimen other than nasopharyngeal swab, presence of  viral mutation(s) within the areas targeted by this assay, and inadequate number of  viral copies(<138 copies/mL). A negative result must be combined with clinical observations, patient history, and epidemiological information. The expected result is Negative.  Fact Sheet for Patients:  EntrepreneurPulse.com.au  Fact Sheet for Healthcare Providers:  IncredibleEmployment.be  This test is no t yet approved or cleared by the [place] FDA and  has been authorized for detection and/or diagnosis of SARS-CoV-2 by FDA under an Emergency Use Authorization (EUA). This EUA will remain  in effect (meaning this test can be used) for the duration of the COVID-19 declaration under Section 564(b)(1) of the Act, 21 U.S.C.section 360bbb-3(b)(1), unless the authorization is terminated  or revoked sooner.       Influenza A by PCR NEGATIVE NEGATIVE Final   Influenza B by PCR NEGATIVE NEGATIVE Final    Comment: (NOTE) The Xpert Xpress SARS-CoV-2/FLU/RSV plus assay is intended as an aid in the diagnosis of influenza from Nasopharyngeal swab specimens and should not be used as a sole basis for treatment. Nasal washings and aspirates are unacceptable for Xpert Xpress SARS-CoV-2/FLU/RSV testing.  Fact Sheet for Patients: EntrepreneurPulse.com.au  Fact Sheet for Healthcare Providers: IncredibleEmployment.be  This test is not yet approved or cleared by the [place] FDA and has been authorized for detection and/or diagnosis of SARS-CoV-2 by FDA under an Emergency Use Authorization (EUA). This EUA will remain in effect (meaning this test can be used) for the duration of the COVID-19 declaration under Section 564(b)(1) of the Act, 21 U.S.C. section 360bbb-3(b)(1), unless the authorization is terminated or revoked.  Performed at [facility], [address] [place], [place] [address]   Blood Culture (routine x 2)     Status: None   Collection Time: 06/27/2020  6:43 PM   Specimen: BLOOD  Result  Value Ref Range Status   Specimen Description BLOOD RIGHT ANTECUBITAL  Final   Special Requests   Final    BOTTLES DRAWN AEROBIC ONLY Blood Culture adequate volume   Culture   Final    NO GROWTH 5 DAYS Performed at [facility], [address], [place], [place] [address]    Report Status 06/26/2020 FINAL  Final  Blood Culture (routine x 2)     Status: None (Preliminary result)   Collection Time: 07/18/2020  6:43 PM   Specimen: BLOOD  Result Value Ref Range Status   Specimen Description   Final    BLOOD LEFT ANTECUBITAL Performed at [facility], [address], [place], [place] [address]    Special Requests   Final    BOTTLES DRAWN AEROBIC ONLY Blood Culture adequate volume Performed at [facility], [address] [place], [place] [address]    Culture  Setup Time   Final    GRAM POSITIVE RODS AEROBIC BOTTLE ONLY CRITICAL RESULT CALLED TO, READ BACK BY AND VERIFIED WITH: MYRA FLOWERS AT 5329 ON 06/26/2020 [place]. Performed at [facility], [address], [place], [place] [address]    Culture   Final    Lonell Grandchild POSITIVE RODS TOO YOUNG TO READ Performed at [facility], [place] [address], [place],  [address]    Report Status PENDING  Incomplete  Resp Panel by RT-PCR (Flu A&B, Covid) Nasopharyngeal Swab     Status: None   Collection Time: 07/19/2020  9:39 PM   Specimen: Nasopharyngeal Swab; Nasopharyngeal(NP) swabs in vial transport medium  Result Value Ref Range Status   SARS Coronavirus 2 by  RT PCR NEGATIVE NEGATIVE Final    Comment: (NOTE) SARS-CoV-2 target nucleic acids are NOT DETECTED.  The SARS-CoV-2 RNA is generally detectable in upper respiratory specimens during the acute phase of infection. The lowest concentration of SARS-CoV-2 viral copies this assay can detect is 138 copies/mL. A negative result does not preclude SARS-Cov-2 infection and should not be used as the sole basis for treatment or other patient management  decisions. A negative result may occur with  improper specimen collection/handling, submission of specimen other than nasopharyngeal swab, presence of viral mutation(s) within the areas targeted by this assay, and inadequate number of viral copies(<138 copies/mL). A negative result must be combined with clinical observations, patient history, and epidemiological information. The expected result is Negative.  Fact Sheet for Patients:  EntrepreneurPulse.com.au  Fact Sheet for Healthcare Providers:  IncredibleEmployment.be  This test is no t yet approved or cleared by the [place] FDA and  has been authorized for detection and/or diagnosis of SARS-CoV-2 by FDA under an Emergency Use Authorization (EUA). This EUA will remain  in effect (meaning this test can be used) for the duration of the COVID-19 declaration under Section 564(b)(1) of the Act, 21 U.S.C.section 360bbb-3(b)(1), unless the authorization is terminated  or revoked sooner.       Influenza A by PCR NEGATIVE NEGATIVE Final   Influenza B by PCR NEGATIVE NEGATIVE Final    Comment: (NOTE) The Xpert Xpress SARS-CoV-2/FLU/RSV plus assay is intended as an aid in the diagnosis of influenza from Nasopharyngeal swab specimens and should not be used as a sole basis for treatment. Nasal washings and aspirates are unacceptable for Xpert Xpress SARS-CoV-2/FLU/RSV testing.  Fact Sheet for Patients: EntrepreneurPulse.com.au  Fact Sheet for Healthcare Providers: IncredibleEmployment.be  This test is not yet approved or cleared by the [place] FDA and has been authorized for detection and/or diagnosis of SARS-CoV-2 by FDA under an Emergency Use Authorization (EUA). This EUA will remain in effect (meaning this test can be used) for the duration of the COVID-19 declaration under Section 564(b)(1) of the Act, 21 U.S.C. section 360bbb-3(b)(1), unless the  authorization is terminated or revoked.  Performed at [facility], [address] [place], [place] [address]   MRSA PCR Screening     Status: None   Collection Time: 06/23/20  2:45 AM   Specimen: Nasopharyngeal  Result Value Ref Range Status   MRSA by PCR NEGATIVE NEGATIVE Final    Comment:        The GeneXpert MRSA Assay (FDA approved for NASAL specimens only), is one component of a comprehensive MRSA colonization surveillance program. It is not intended to diagnose MRSA infection nor to guide or monitor treatment for MRSA infections. Performed at [facility], [address], [place], [place] [address]           IMAGING    No results found.   Nutrition Status: Nutrition Problem: Inadequate oral intake Etiology: inability to eat (pt sedated and ventilated) Signs/Symptoms: NPO status       Indwelling Urinary Catheter continued, requirement due to   Reason to continue Indwelling Urinary Catheter strict Intake/Output monitoring for hemodynamic instability   Central Line/ continued, requirement due to  Reason to continue [place] of central venous pressure or other hemodynamic parameters and poor IV access   Ventilator continued, requirement due to severe respiratory failure   Ventilator Sedation RASS 0 to -2      ASSESSMENT AND PLAN SYNOPSIS  Severe ACUTE Hypoxic and Hypercapnic Respiratory  Failuredue to acute pulm edema due to acute sCHF and dCHF exacerbation, self extubated plan for biPAP-failed biPAP and was re intubated-worsening resp failure from severe Systolic CHF and edema, failing weaning trials    Severe ACUTE Hypoxic and Hypercapnic Respiratory Failure -continue Full MV support -continue Bronchodilator Therapy -Wean Fio2 and PEEP as tolerated -will perform SAT/SBT when respiratory parameters are met -VAP/VENT bundle implementation Plan for one way extubation at some point will need palliative care  assistance with family  ACUTE SYSTOLIC CARDIAC FAILURE- EF -oxygen as needed -Lasix as tolerated  ACUTE KIDNEY INJURY/Renal Failure -continue Foley Catheter-assess need -Avoid nephrotoxic agents -Follow urine output, BMP -Ensure adequate renal perfusion, optimize oxygenation -Renal dose medications HD as needed     NEUROLOGY - intubated and sedated - minimal sedation to achieve a RASS goal: -1 Wake up assessment pending  CARDIAC ICU monitoring   GI GI PROPHYLAXIS as indicated  NUTRITIONAL STATUS Nutrition Status: Nutrition Problem: Inadequate oral intake Etiology: inability to eat (pt sedated and ventilated) Signs/Symptoms: NPO status     DIET-->TF's as tolerated Constipation protocol as indicated  ENDO - will use ICU hypoglycemic\Hyperglycemia protocol if indicated     ELECTROLYTES -follow labs as needed -replace as needed -pharmacy consultation and following   DVT/GI PRX ordered and assessed TRANSFUSIONS AS NEEDED MONITOR FSBS I Assessed the need for Labs I Assessed the need for Foley I Assessed the need for Central Venous Line Family Discussion when available I Assessed the need for Mobilization I made an Assessment of medications to be adjusted accordingly Safety Risk assessment completed   CASE DISCUSSED IN MULTIDISCIPLINARY ROUNDS WITH ICU TEAM  Critical Care Time devoted to patient care services described in this note is 45  minutes.   Overall, patient is critically ill, prognosis is guarded.  Patient with Multiorgan failure and at high risk for cardiac arrest and death.   Patient is DNR, plan for one way extubation when all family arrives  Corrin Parker, M.D.  [facility]  Medical Director [place] Director [facility] Cardio-Pulmonary Department

## 2020-06-28 NOTE — Consult Note (Signed)
[place] for heparin infusion Indication: atrial fibrillation  Allergies  Allergen Reactions  . Hydrochlorothiazide     Pancreatitis    Patient Measurements: Height: 5' 0.98" (154.9 cm) Weight: 64.1 kg (141 lb 5 oz) IBW/kg (Calculated) : 47.76 Heparin Dosing Weight: 57 kg  Vital Signs: Temp: 100.58 F (38.1 C) (01/10 1200) Temp Source: Esophageal (01/10 1200) BP: 121/57 (01/10 1200) Pulse Rate: 82 (01/10 1200)  Labs: Recent Labs    06/26/20 0500 06/26/20 0500 06/26/20 2045 06/27/20 0319 06/27/20 1641 06/28/20 0245 06/28/20 1322  HGB 8.3*  --   --  7.5*  --  7.9*  --   HCT 25.7*  --   --  23.5*  --  23.5*  --   PLT 263  --   --  214  --  231  --   APTT  --   --  45*  --   --   --   --   LABPROT  --   --  14.2  --   --   --   --   INR  --   --  1.1  --   --   --   --   HEPARINUNFRC  --    < >  --  0.26* 0.13* 0.16* 0.25*  CREATININE 3.72*  --   --  4.37*  --  4.75*  --    < > = values in this interval not displayed.    Estimated Creatinine Clearance: 10.4 mL/min (A) (by C-G formula based on SCr of 4.75 mg/dL (H)).   Medical History: Past Medical History:  Diagnosis Date  . Diabetes mellitus without complication ([place])   . 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 Hyperlipidemia   . Hypertension      Assessment: 65 y.o. female admitted with acute hypoxic respiratory failure, AKI on CKD stage IV. Patient with new onset Afib. Pharmacy has been consulted for heparin infusion. Hemoglobin stable ~ 8.0.   1/9 0319 HL 0.26  1/9 1641 HL 0.13, subtherapeutic s/p 900 units bolus x 1 followed by  increased heparin infusion to 900 units/hr- both were given. Given this level was lower than previous HL, will need to ensure heparin wasn't interrupted. Inquired with nursing whether heparin was interrupted and confirmed no line occlusions or interruptions.  01/10 0245 HL 0.16, subtherapeutic 01/10 1322 HL 0.25,  subtherapeutic  Goal of Therapy:  Heparin level 0.3-0.7 units/ml Monitor platelets by anticoagulation protocol: Yes   Plan:  Heparin 800 unit bolus followed by increase in drip to 1350 units/hr. Recheck heparin level in 8 hours. CBC daily while on heparin.   Dorena Bodo, PharmD 06/28/2020 2:23 PM

## 2020-06-28 NOTE — Consult Note (Signed)
[facility] for heparin infusion Indication: atrial fibrillation  Allergies  Allergen Reactions  . Hydrochlorothiazide     Pancreatitis    Patient Measurements: Height: 5' 0.98" (154.9 cm) Weight: 64.1 kg (141 lb 5 oz) IBW/kg (Calculated) : 47.76 Heparin Dosing Weight: 57 kg  Vital Signs: Temp: 100.22 F (37.9 C) (01/10 0400) Temp Source: Esophageal (01/10 0400) BP: 132/66 (01/10 0400) Pulse Rate: 82 (01/10 0400)  Labs: Recent Labs    06/26/20 0500 06/26/20 2045 06/27/20 0319 06/27/20 1641 06/28/20 0245  HGB 8.3*  --  7.5*  --  7.9*  HCT 25.7*  --  23.5*  --  23.5*  PLT 263  --  214  --  231  APTT  --  45*  --   --   --   LABPROT  --  14.2  --   --   --   INR  --  1.1  --   --   --   HEPARINUNFRC  --   --  0.26* 0.13* 0.16*  CREATININE 3.72*  --  4.37*  --  4.75*    Estimated Creatinine Clearance: 10.4 mL/min (A) (by C-G formula based on SCr of 4.75 mg/dL (H)).   Medical History: Past Medical History:  Diagnosis Date  . Diabetes mellitus without complication ([place])   . 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 Hyperlipidemia   . Hypertension     Medications:  Heparin SQ 5,000 units -- last dose 1/8 1307  Assessment: 65 y.o. female admitted with acute hypoxic respiratory failure, AKI on CKD stage IV. Patient with new onset Afib. Pharmacy has been consulted for heparin infusion. Hemoglobin stable ~ 8.0.   1/9 0319 HL 0.26  1/9 1641 HL 0.13, subtherapeutic s/p 900 units bolus x 1 followed by  increased heparin infusion to 900 units/hr- both were given. Given this level was lower than previous HL, will need to ensure heparin wasn't interrupted. Inquired with nursing whether heparin was interrupted and confirmed no line occlusions or interruptions.  01/10 0245 HL 0.16, subtherapeutic   Goal of Therapy:  Heparin level 0.3-0.7 units/ml Monitor platelets by anticoagulation protocol: Yes   Plan:  Heparin level  is subtherapeutic. Will give 1700 units bolus x 1. Will increase heparin infusion to 1200 units/hr. Recheck heparin level in 8 hours. CBC daily while on heparin.   Renda Rolls, PharmD, [facility] 06/28/2020 5:23 AM

## 2020-06-29 DIAGNOSIS — N051 Unspecified nephritic syndrome with focal and segmental glomerular lesions: Secondary | ICD-10-CM

## 2020-06-29 DIAGNOSIS — Z978 Presence of other specified devices: Secondary | ICD-10-CM

## 2020-06-29 DIAGNOSIS — I509 Heart failure, unspecified: Secondary | ICD-10-CM

## 2020-06-29 LAB — GLUCOSE, CAPILLARY
Glucose-Capillary: 122 mg/dL — ABNORMAL HIGH (ref 70–99)
Glucose-Capillary: 124 mg/dL — ABNORMAL HIGH (ref 70–99)
Glucose-Capillary: 130 mg/dL — ABNORMAL HIGH (ref 70–99)
Glucose-Capillary: 148 mg/dL — ABNORMAL HIGH (ref 70–99)
Glucose-Capillary: 151 mg/dL — ABNORMAL HIGH (ref 70–99)
Glucose-Capillary: 156 mg/dL — ABNORMAL HIGH (ref 70–99)
Glucose-Capillary: 159 mg/dL — ABNORMAL HIGH (ref 70–99)
Glucose-Capillary: 172 mg/dL — ABNORMAL HIGH (ref 70–99)

## 2020-06-29 LAB — HEPARIN LEVEL (UNFRACTIONATED)
Heparin Unfractionated: 0.36 IU/mL (ref 0.30–0.70)
Heparin Unfractionated: 0.32 IU/mL (ref 0.30–0.70)

## 2020-06-29 LAB — CULTURE, BLOOD (ROUTINE X 2): Special Requests: ADEQUATE

## 2020-06-29 LAB — CBC
HCT: 22.7 % — ABNORMAL LOW (ref 36.0–46.0)
Hemoglobin: 7.5 g/dL — ABNORMAL LOW (ref 12.0–15.0)
MCH: 30 pg (ref 26.0–34.0)
MCHC: 33 g/dL (ref 30.0–36.0)
MCV: 90.8 fL (ref 80.0–100.0)
Platelets: 260 10*3/uL (ref 150–400)
RBC: 2.5 MIL/uL — ABNORMAL LOW (ref 3.87–5.11)
RDW: 14.7 % (ref 11.5–15.5)
WBC: 16.1 10*3/uL — ABNORMAL HIGH (ref 4.0–10.5)
nRBC: 0 % (ref 0.0–0.2)

## 2020-06-29 LAB — MAGNESIUM: Magnesium: 1.7 mg/dL (ref 1.7–2.4)

## 2020-06-29 LAB — PHOSPHORUS: Phosphorus: 2.8 mg/dL (ref 2.5–4.6)

## 2020-06-29 LAB — TRIGLYCERIDES: Triglycerides: 228 mg/dL — ABNORMAL HIGH (ref <150)

## 2020-06-29 MED ORDER — BISACODYL 10 MG RE SUPP
10.0000 mg | Freq: Once | RECTAL | Status: AC
  Administered 2020-06-29: 10 mg via RECTAL
  Filled 2020-06-29: qty 1

## 2020-06-29 MED ORDER — HEPARIN BOLUS VIA INFUSION
1700.0000 [IU] | Freq: Once | INTRAVENOUS | Status: AC
  Administered 2020-06-29: 1700 [IU] via INTRAVENOUS
  Filled 2020-06-29: qty 1700

## 2020-06-29 NOTE — Plan of Care (Signed)
  Problem: Health Behavior/Discharge Planning: Goal: Ability to manage health-related needs will improve Outcome: Not Progressing   Problem: Clinical Measurements: Goal: Respiratory complications will improve Outcome: Not Progressing, Pt cont on full vent support   Problem: Clinical Measurements: Goal: Cardiovascular complication will be avoided Outcome: Not Progressing, Pt continue on [place] gtt   Problem: Activity: Goal: Risk for activity intolerance will decrease Outcome: Not Progressing, Pt continue on full vent supoot   Problem: Activity: Goal: Ability to tolerate increased activity will improve Outcome: Not Progressing

## 2020-06-29 NOTE — Consult Note (Signed)
[place] for heparin infusion Indication: atrial fibrillation  Allergies  Allergen Reactions  . Hydrochlorothiazide     Pancreatitis    Patient Measurements: Height: 5' 0.98" (154.9 cm) Weight: 64.1 kg (141 lb 5 oz) IBW/kg (Calculated) : 47.76 Heparin Dosing Weight: 57 kg  Vital Signs: Temp: 99.9 F (37.7 C) (01/11 0115) Temp Source: Esophageal (01/10 1600) BP: 135/62 (01/11 0115) Pulse Rate: 88 (01/11 0115)  Labs: Recent Labs    06/26/20 0500 06/26/20 2045 06/27/20 0319 06/27/20 1641 06/28/20 0245 06/28/20 1322 06/28/20 2230  HGB 8.3*  --  7.5*  --  7.9*  --   --   HCT 25.7*  --  23.5*  --  23.5*  --   --   PLT 263  --  214  --  231  --   --   APTT  --  45*  --   --   --   --   --   LABPROT  --  14.2  --   --   --   --   --   INR  --  1.1  --   --   --   --   --   HEPARINUNFRC  --   --  0.26*   < > 0.16* 0.25* 0.17*  CREATININE 3.72*  --  4.37*  --  4.75*  --   --    < > = values in this interval not displayed.    Estimated Creatinine Clearance: 10.4 mL/min (A) (by C-G formula based on SCr of 4.75 mg/dL (H)).   Medical History: Past Medical History:  Diagnosis Date  . Diabetes mellitus without complication ([place])   . 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 Hyperlipidemia   . Hypertension      Assessment: 65 y.o. female admitted with acute hypoxic respiratory failure, AKI on CKD stage IV. Patient with new onset Afib. Pharmacy has been consulted for heparin infusion. Hemoglobin stable ~ 8.0.   1/9 0319 HL 0.26  1/9 1641 HL 0.13, subtherapeutic s/p 900 units bolus x 1 followed by  increased heparin infusion to 900 units/hr- both were given. Given this level was lower than previous HL, will need to ensure heparin wasn't interrupted. Inquired with nursing whether heparin was interrupted and confirmed no line occlusions or interruptions.  01/10 0245 HL 0.16, subtherapeutic 01/10 1322 HL 0.25,  subtherapeutic 01/10 2230 HL 0.17 subtherapeutic   Goal of Therapy:  Heparin level 0.3-0.7 units/ml Monitor platelets by anticoagulation protocol: Yes   Plan:  1/10:  HL @ 2230 = 0.17 Will order heparin 1700 units IV X 1 bolus and increase drip rate to 1550 units/hr.  Will recheck HL 8 hrs after rate change.    06/29/2020 1:47 AM

## 2020-06-29 NOTE — Progress Notes (Signed)
Daughter showed up to visit patient 1/11, around 4:30 p.m. Daughter was not allowed to visit, there had been the maximum allowed for the day. Daughter raised concerns about information given to the other family members, she stated it was different from what she had been told. Daughter's main concerned raised was the language barrier, along with clearly understanding what is relayed. She stated she is the strong one for communication for the family.  Staff took the contact information to be passed along to doctor.

## 2020-06-29 NOTE — Progress Notes (Signed)
Daily Progress Note   Patient Name: Stephanie Casey       Date: 06/29/2020 DOB: Nov 19, 1955  Age: 65 y.o. MRN#: [id number] Attending Physician: Flora Lipps, MD Primary Care Physician: Freddy Finner, NP Admit Date: 07/12/2020  Reason for Consultation/Follow-up: Establishing goals of care  Subjective: Patient remains intubated/sedated on 28% FIO2. She intermittently will open eyes to voice. She does not appear to be in pain or distress.  GOC:  Met with husband, Stephanie Casey along with Spanish interpretor Stephanie Casey at bedside to discuss goals of care and plan of care. Two family friends present by permission of husband.   Introduced self with palliative medicine team and follow-up from his conversations with Darol Destine, NP last week.   Explored husband's understanding of [name]'s condition. He acknowledges being told that she is very sick with heart, lung, and kidney failure. He confirms a decision that he would not wish to 'prolong' long-term. He is appreciative of critical care team allowing multiple family members to visit through the weekend. They have 9 children but only 3 that are local. The three local children were able to visit. Discussed course of hospitalization including diagnoses, interventions, plan of care in detail.   Discussed next steps of preparing for one-way extubation, explaining that we need to prepare ourselves for 'anything to happen' once extubated. She will breath on her own but how will can she sustain her respiratory status. Reminded Stephanie Casey that when tube was removed previously, she required re-intubation pretty quickly due to respiratory distress. Prepared Stephanie Casey and family friends that she may decline following extubation due to multi-organ failure.   Translator shares that the  husband and friends believe Stephanie Casey is showing improvement because of her mental status through the weekend. She was opening eyes, listening to family, and even shed a tear when she saw her son. Reiterated that although Stephanie Casey is more awake and alert on ventilator, she still remain critically ill due to multi-organ failure.   Discussed recommendation for one-way extubation. Husband prefer to allow Stephanie Casey one more day. "Trusting in God" and remaining hopeful that the medications are working. Discussed plan for continuing current plan of care and medical management and follow-up meeting in AM with plans to extubate her. Encouraged husband to bring three children tomorrow morning if it is important for them to be present during  extubation.   Family asks if she will receive more dialysis. Again explained tenuous respiratory status and monitoring how she does once extubated in order to determine further plan of care/whether she will be a candidate for further dialysis.   Therapeutic listening. Emotional/spiritual support provided. PMT contact information given. Answered all questions and concerns.    Length of Stay: 8  Current Medications: Scheduled Meds:  . aspirin  81 mg Per Tube Daily  . chlorhexidine gluconate (MEDLINE KIT)  15 mL Mouth Rinse BID  . Chlorhexidine Gluconate Cloth  6 each Topical Q0600  . docusate  100 mg Per Tube BID  . epoetin (EPOGEN/PROCRIT) injection  10,000 Units Intravenous Q T,Th,Sa-HD  . feeding supplement (PROSource TF)  45 mL Per Tube Daily  . furosemide  40 mg Intravenous BID  . hydrALAZINE  25 mg Per Tube BID  . insulin aspart  0-20 Units Subcutaneous Q4H  . mouth rinse  15 mL Mouth Rinse 10 times per day  . multivitamin  15 mL Per Tube Daily  . polyethylene glycol  17 g Per Tube Daily    Continuous Infusions: . amiodarone 30 mg/hr (06/29/20 0708)  . famotidine (PEPCID) IV Stopped (06/28/20 2052)  . feeding supplement (VITAL AF 1.2 CAL) 45 mL/hr at 06/28/20  1900  . fentaNYL infusion INTRAVENOUS 100 mcg/hr (06/29/20 0708)  . heparin 1,550 Units/hr (06/29/20 0708)  . propofol (DIPRIVAN) infusion Stopped (06/29/20 0559)    PRN Meds: acetaminophen **OR** acetaminophen, fentaNYL, haloperidol lactate, ipratropium-albuterol, labetalol, LORazepam, ondansetron (ZOFRAN) IV, vecuronium  Physical Exam Vitals and nursing note reviewed.  Constitutional:      Appearance: She is ill-appearing.     Interventions: She is sedated and intubated.  Cardiovascular:     Rate and Rhythm: Tachycardia present.  Pulmonary:     Effort: No tachypnea, accessory muscle usage or respiratory distress. She is intubated.     Breath sounds: Normal breath sounds.  Abdominal:     Tenderness: There is no abdominal tenderness.  Skin:    General: Skin is warm and dry.  Neurological:     Mental Status: She is easily aroused.     Comments: Sedated, opens eyes to voice            Vital Signs: BP 131/62   Pulse 95   Temp 99.5 F (37.5 C)   Resp 16   Ht 5' 0.98" (1.549 m)   Wt 64.1 kg   SpO2 92%   BMI 26.72 kg/m  SpO2: SpO2: 92 % O2 Device: O2 Device: Ventilator O2 Flow Rate: O2 Flow Rate (L/min): 15 L/min  Intake/output summary:   Intake/Output Summary (Last 24 hours) at 06/29/2020 0929 Last data filed at 06/29/2020 7903 Gross per 24 hour  Intake 2370.45 ml  Output 1000 ml  Net 1370.45 ml   LBM: Last BM Date: 07/18/2020 Baseline Weight: Weight: 68 kg Most recent weight: Weight: 64.1 kg       Palliative Assessment/Data: PPS 30%    Flowsheet Rows   Flowsheet Row Most Recent Value  Intake Tab   Referral Department Critical care  Unit at Time of Referral ICU  Palliative Care Primary Diagnosis Pulmonary  Date Notified 06/25/20  Palliative Care Type New Palliative care  Reason for referral Clarify Goals of Care  Date of Admission 07/17/2020  Date first seen by Palliative Care 06/25/20  # of days Palliative referral response time 0 Day(s)  # of days IP  prior to Palliative referral 4  Clinical Assessment  Psychosocial & Spiritual Assessment   Palliative Care Outcomes   Patient/Family meeting held? Yes  Who was at the meeting? spouse  Palliative Care Outcomes Clarified goals of care, Provided advance care planning, Provided psychosocial or spiritual support, Changed CPR status      Patient Active Problem List   Diagnosis Date Noted  . 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 Acute renal failure (ARF) ([place]) 06/23/2020  . Acute respiratory failure with hypoxia ([place]) 07/14/2020  . Acute heart failure with preserved ejection fraction (HFpEF) ([place]) 07/14/2020  . Hypertension   . Diabetes mellitus without complication ([place])   . ARF (acute renal failure) ([place])   . SOB (shortness of breath)   . Elevated troponin   . COVID-19 02/10/2019  . Intractable nausea and vomiting 02/09/2019    Palliative Care Assessment & Plan   Patient Profile: 65 y.o. 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 She failed trial of bipap and required intubation. She self-extubated 1/6 but required reintubation.  PMT consulted to discuss Stephanie Casey.  Assessment: Severe acute hypoxic and hypercapnic respiratory failure Pulmonary edema Acute systolic CHF and diastolic CHF exacerbation AKI on CKD, now ESRD requiring hemodialysis Anemia with chronic kidney disease  Recommendations/Plan:  F/u GOC with patient's husband Stephanie Casey) at bedside along with two family friends and interpretor.   Husband wishes to allow one more day on the ventilator. He feels she is showing improvement because she is more awake, alert, and acknowledging family. Explained to husband multi-organ failure and concern that we need to prepare  ourselves for decline following extubation.   Plan for one-way extubation tomorrow 1/12 ~0930 AM. Please allow husband and children visitor access for extubation. Will monitor post-extubation. If decline, transition to comfort.   PMT will follow.   Code Status: DNR   Code Status Orders  (From admission, onward)         Start     Ordered   06/25/20 1350  Do not attempt resuscitation (DNR)  Continuous       Question Answer Comment  In the event of cardiac or respiratory ARREST Do not call a "code blue"   In the event of cardiac or respiratory ARREST Do not perform Intubation, CPR, defibrillation or ACLS   In the event of cardiac or respiratory ARREST Use medication by any route, position, wound care, and other measures to relive pain and suffering. May use oxygen, suction and manual treatment of airway obstruction as needed for comfort.      06/25/20 1349        Code Status History    Date Active Date Inactive Code Status Order ID Comments User Context   07/19/2020 1959 06/25/2020 1349 Full Code [id number]  Rhetta Mura DO ED   02/09/2019 1835 02/10/2019 1719 Full Code [id number]  Mayo, Pete Pelt, MD Inpatient   Advance Care Planning Activity       Prognosis:  Poor prognosis  Discharge Planning:  To Be Determined  Care plan was discussed with Dr. Mortimer Fries, RN, husband, family friends at bedside, medical interpretor  Thank you for allowing the Palliative Medicine Team to assist in the care of this patient.   Total Time 75 Prolonged Time Billed  yes    Greater than 50% of this time was spent counseling and coordinating care related  to the above assessment and plan.   Stephanie Dow, DNP, FNP-C Palliative Medicine Team  Phone: [phone] Fax: [phone]  Please contact Palliative Medicine Team phone at [phone] for questions and concerns.

## 2020-06-29 NOTE — Consult Note (Signed)
[facility] for heparin infusion Indication: atrial fibrillation  Allergies  Allergen Reactions  . Hydrochlorothiazide     Pancreatitis    Patient Measurements: Height: 5' 0.98" (154.9 cm) Weight: 64.1 kg (141 lb 5 oz) IBW/kg (Calculated) : 47.76 Heparin Dosing Weight: 57 kg  Vital Signs: Temp: 100.94 F (38.3 C) (01/11 1200) Temp Source: Esophageal (01/11 1200) BP: 129/61 (01/11 1200) Pulse Rate: 93 (01/11 1200)  Labs: Recent Labs     0000 06/26/20 2045 06/27/20 0319 06/27/20 1641 06/28/20 0245 06/28/20 1322 06/28/20 2230 06/29/20 0425 06/29/20 1230  HGB   < >  --  7.5*  --  7.9*  --   --  7.5*  --   HCT  --   --  23.5*  --  23.5*  --   --  22.7*  --   PLT  --   --  214  --  231  --   --  260  --   APTT  --  45*  --   --   --   --   --   --   --   LABPROT  --  14.2  --   --   --   --   --   --   --   INR  --  1.1  --   --   --   --   --   --   --   HEPARINUNFRC  --   --  0.26*   < > 0.16* 0.25* 0.17*  --  0.36  CREATININE  --   --  4.37*  --  4.75*  --   --   --   --    < > = values in this interval not displayed.    Estimated Creatinine Clearance: 10.4 mL/min (A) (by C-G formula based on SCr of 4.75 mg/dL (H)).   Medical History: Past Medical History:  Diagnosis Date  . Diabetes mellitus without complication ([place])   . 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 Hyperlipidemia   . Hypertension      Assessment: 65 y.o. female admitted with acute hypoxic respiratory failure, AKI on CKD stage IV. Patient with new onset Afib. Pharmacy has been consulted for heparin infusion. Hemoglobin stable ~ 8.0.   1/9 0319 HL 0.26  1/9 1641 HL 0.13, subtherapeutic s/p 900 units bolus x 1 followed by  increased heparin infusion to 900 units/hr- both were given. Given this level was lower than previous HL, will need to ensure heparin wasn't interrupted. Inquired with nursing whether heparin was interrupted and confirmed no  line occlusions or interruptions.  01/10 0245 HL 0.16, subtherapeutic 01/10 1322 HL 0.25, subtherapeutic 01/10 2230 HL 0.17 subtherapeutic 01/11 1230 HL 0.36, therapeutic x 1  Goal of Therapy:  Heparin level 0.3-0.7 units/ml Monitor platelets by anticoagulation protocol: Yes   Plan:  Continue heparin drip at 1550 units/hr. Recheck HL at 2100 to confirm. CBC with morning labs.  Dorena Bodo, PharmD Clinical Pharmacist 06/29/2020 1:15 PM

## 2020-06-29 NOTE — Progress Notes (Signed)
CRITICAL CARE NOTE  65 y.o. 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 Failed trial of BiPAP in ED requiring emergent intubation.  1/4: Right Femoral Trialysis placed by Vascular Surgery  Significant Diagnostic Tests:  1/3: CXR>>Moderate to marked severity predominantly bilateral perihilar, bilateral suprahilar and bilateral infrahilar infiltrates are seen. Extension to involve the bilateral lung bases is also noted. There are small bilateral pleural effusions. No pneumothorax is identified. The cardiac silhouette is moderately enlarged. The visualized skeletal structures are unremarkable. 1/3: Renal US>>Atrophic and echogenic bilateral kidneys suggesting renal parenchymal disease with an underlying mass within the left kidney not excluded. Recommend MRI or CT renal protocol for further evaluation.  Micro Data:  1/3: SARS-CoV-2 PCR>>negative 1/3: Influenza A&B PCR>>negative 1/3: Blood culture x2>> 1/4: HIV Screen>>nonreactive 1/4: Hepatitis B Surface Ag>>nonreactive 1/4:Hepatitis B S ab>>nonreactive 1/4: Hepatitis B core Ab, IgM>>nonreactive 1/4: Hepatitis C Ab>>nonreactive      1/4 admitted to ICU for acute pulm edema 1/5 remains on vent did NOT tolerate HD due to hypotension 1/6 self extubated-on biPAP 1/6 re intubated emergently 1/7 Met with palliative and made DNR 1/10 DNR, patient with severe heart failure,+renal failure       CC Follow up CHF  SUBJECTIVE Prognosis is guarded Remains critically ill   BP 129/61 (BP Location: Right Arm)   Pulse 93   Temp (!) 100.94 F (38.3 C) (Esophageal)   Resp 16   Ht 5' 0.98" (1.549 m)   Wt 64.1 kg   SpO2 93%   BMI 26.72 kg/m    I/O last 3 completed shifts: In: 3516.6 [I.V.:1656.4; NG/GT:1760.3; IV Piggyback:99.9] Out: 1800 [Urine:1800] Total I/O In: 268.4 [I.V.:268.4] Out: 250 [Urine:250]  SpO2: 93  % O2 Flow Rate (L/min): 15 L/min FiO2 (%): 28 %  Estimated body mass index is 26.72 kg/m as calculated from the following:   Height as of this encounter: 5' 0.98" (1.549 m).   Weight as of this encounter: 64.1 kg.   REVIEW OF SYSTEMS  PATIENT IS UNABLE TO PROVIDE COMPLETE REVIEW OF SYSTEM S DUE TO SEVERE CRITICAL ILLNESS AND ENCEPHALOPATHY   PHYSICAL EXAMINATION:  GENERAL:critically ill appearing, +resp distress HEAD: Normocephalic, atraumatic.  EYES: Pupils equal, round, reactive to light.  No scleral icterus.  MOUTH: Moist mucosal membrane. NECK: Supple. No thyromegaly. No nodules. No JVD.  PULMONARY: +rhonchi, +wheezing CARDIOVASCULAR: S1 and S2. Regular rate and rhythm. No murmurs, rubs, or gallops.  GASTROINTESTINAL: Soft, nontender, -distended. Positive bowel sounds.  MUSCULOSKELETAL: No swelling, clubbing, or edema.  NEUROLOGIC: obtunded SKIN:intact,warm,dry   MEDICATIONS: I have reviewed all medications and confirmed regimen as documented   CULTURE RESULTS   Recent Results (from the past 240 hour(s))  Resp Panel by RT-PCR (Flu A&B, Covid) Nasopharyngeal Swab     Status: None   Collection Time: 06/28/2020  6:43 PM   Specimen: Nasopharyngeal Swab; Nasopharyngeal(NP) swabs in vial transport medium  Result Value Ref Range Status   SARS Coronavirus 2 by RT PCR NEGATIVE NEGATIVE Final    Comment: (NOTE) SARS-CoV-2 target nucleic acids are NOT DETECTED.  The SARS-CoV-2 RNA is generally detectable in upper respiratory specimens during the acute phase of infection. The lowest concentration of SARS-CoV-2 viral copies this assay can detect is 138 copies/mL. A negative result does not preclude SARS-Cov-2 infection and should not be used as the sole basis for treatment or other patient management decisions. A negative result may occur with  improper specimen collection/handling,  submission of specimen other than nasopharyngeal swab, presence of viral mutation(s) within  the areas targeted by this assay, and inadequate number of viral copies(<138 copies/mL). A negative result must be combined with clinical observations, patient history, and epidemiological information. The expected result is Negative.  Fact Sheet for Patients:  EntrepreneurPulse.com.au  Fact Sheet for Healthcare Providers:  IncredibleEmployment.be  This test is no t yet approved or cleared by the [place] FDA and  has been authorized for detection and/or diagnosis of SARS-CoV-2 by FDA under an Emergency Use Authorization (EUA). This EUA will remain  in effect (meaning this test can be used) for the duration of the COVID-19 declaration under Section 564(b)(1) of the Act, 21 U.S.C.section 360bbb-3(b)(1), unless the authorization is terminated  or revoked sooner.       Influenza A by PCR NEGATIVE NEGATIVE Final   Influenza B by PCR NEGATIVE NEGATIVE Final    Comment: (NOTE) The Xpert Xpress SARS-CoV-2/FLU/RSV plus assay is intended as an aid in the diagnosis of influenza from Nasopharyngeal swab specimens and should not be used as a sole basis for treatment. Nasal washings and aspirates are unacceptable for Xpert Xpress SARS-CoV-2/FLU/RSV testing.  Fact Sheet for Patients: EntrepreneurPulse.com.au  Fact Sheet for Healthcare Providers: IncredibleEmployment.be  This test is not yet approved or cleared by the [place] FDA and has been authorized for detection and/or diagnosis of SARS-CoV-2 by FDA under an Emergency Use Authorization (EUA). This EUA will remain in effect (meaning this test can be used) for the duration of the COVID-19 declaration under Section 564(b)(1) of the Act, 21 U.S.C. section 360bbb-3(b)(1), unless the authorization is terminated or revoked.  Performed at [facility], [address] [place], [place] [address]   Blood Culture (routine x 2)     Status: None    Collection Time: 07/07/2020  6:43 PM   Specimen: BLOOD  Result Value Ref Range Status   Specimen Description BLOOD RIGHT ANTECUBITAL  Final   Special Requests   Final    BOTTLES DRAWN AEROBIC ONLY Blood Culture adequate volume   Culture   Final    NO GROWTH 5 DAYS Performed at [facility], [address] [place], [place] [address]    Report Status 06/26/2020 FINAL  Final  Blood Culture (routine x 2)     Status: Abnormal   Collection Time: 06/26/2020  6:43 PM   Specimen: BLOOD  Result Value Ref Range Status   Specimen Description   Final    BLOOD LEFT ANTECUBITAL Performed at [facility], [address] [place], [place] [address]    Special Requests   Final    BOTTLES DRAWN AEROBIC ONLY Blood Culture adequate volume Performed at [facility], [address] [place], [place] [address]    Culture  Setup Time   Final    GRAM POSITIVE RODS AEROBIC BOTTLE ONLY CRITICAL RESULT CALLED TO, READ BACK BY AND VERIFIED WITH: MYRA FLOWERS AT 0940 ON 06/26/2020 [place]. Performed at [facility], [address] [place], [place] [address]    Culture (A)  Final    ACTINOMYCES ODONTOLYTICUS Standardized susceptibility testing for this organism is not available. Performed at [facility], [place] [address], [place], [place] [address]    Report Status 06/29/2020 FINAL  Final  Resp Panel by RT-PCR (Flu A&B, Covid) Nasopharyngeal Swab     Status: None   Collection Time: 07/11/2020  9:39 PM   Specimen: Nasopharyngeal Swab; Nasopharyngeal(NP) swabs in vial transport medium  Result Value Ref Range Status   SARS Coronavirus 2 by RT PCR NEGATIVE NEGATIVE Final    Comment: (NOTE) SARS-CoV-2 target nucleic acids are NOT DETECTED.  The SARS-CoV-2 RNA is generally detectable in upper respiratory specimens during the acute phase of infection. The lowest concentration of SARS-CoV-2 viral copies this assay can detect is 138 copies/mL. A negative result does not  preclude SARS-Cov-2 infection and should not be used as the sole basis for treatment or other patient management decisions. A negative result may occur with  improper specimen collection/handling, submission of specimen other than nasopharyngeal swab, presence of viral mutation(s) within the areas targeted by this assay, and inadequate number of viral copies(<138 copies/mL). A negative result must be combined with clinical observations, patient history, and epidemiological information. The expected result is Negative.  Fact Sheet for Patients:  EntrepreneurPulse.com.au  Fact Sheet for Healthcare Providers:  IncredibleEmployment.be  This test is no t yet approved or cleared by the [place] FDA and  has been authorized for detection and/or diagnosis of SARS-CoV-2 by FDA under an Emergency Use Authorization (EUA). This EUA will remain  in effect (meaning this test can be used) for the duration of the COVID-19 declaration under Section 564(b)(1) of the Act, 21 U.S.C.section 360bbb-3(b)(1), unless the authorization is terminated  or revoked sooner.       Influenza A by PCR NEGATIVE NEGATIVE Final   Influenza B by PCR NEGATIVE NEGATIVE Final    Comment: (NOTE) The Xpert Xpress SARS-CoV-2/FLU/RSV plus assay is intended as an aid in the diagnosis of influenza from Nasopharyngeal swab specimens and should not be used as a sole basis for treatment. Nasal washings and aspirates are unacceptable for Xpert Xpress SARS-CoV-2/FLU/RSV testing.  Fact Sheet for Patients: EntrepreneurPulse.com.au  Fact Sheet for Healthcare Providers: IncredibleEmployment.be  This test is not yet approved or cleared by the [place] FDA and has been authorized for detection and/or diagnosis of SARS-CoV-2 by FDA under an Emergency Use Authorization (EUA). This EUA will remain in effect (meaning this test can be used) for the  duration of the COVID-19 declaration under Section 564(b)(1) of the Act, 21 U.S.C. section 360bbb-3(b)(1), unless the authorization is terminated or revoked.  Performed at [facility], [address] [place], [place] [address]   MRSA PCR Screening     Status: None   Collection Time: 06/23/20  2:45 AM   Specimen: Nasopharyngeal  Result Value Ref Range Status   MRSA by PCR NEGATIVE NEGATIVE Final    Comment:        The GeneXpert MRSA Assay (FDA approved for NASAL specimens only), is one component of a comprehensive MRSA colonization surveillance program. It is not intended to diagnose MRSA infection nor to guide or monitor treatment for MRSA infections. Performed at [facility], [address], [place], [place] [address]           IMAGING    No results found.   Nutrition Status: Nutrition Problem: Inadequate oral intake Etiology: inability to eat (pt sedated and ventilated) Signs/Symptoms: NPO status       Indwelling Urinary Catheter continued, requirement due to   Reason to continue Indwelling Urinary Catheter strict Intake/Output monitoring for hemodynamic instability   Central Line/ continued, requirement due to  Reason to continue [place] of central venous pressure or other hemodynamic parameters and poor IV access   Ventilator continued, requirement due to severe respiratory failure   Ventilator Sedation RASS 0 to -2     ASSESSMENT  AND PLAN SYNOPSIS  Severe ACUTE Hypoxic and Hypercapnic Respiratory Failuredue to acute pulm edema due to acute sCHF and dCHF exacerbation, self extubated plan for biPAP-failed biPAP and was re intubated-worsening resp failure from severe Systolic CHF and edema, failing weaning trials  Plan for one way extubation  ACUTE SYSTOLIC CARDIAC FAILURE- EF -oxygen as needed -Lasix as tolerated  ACUTE KIDNEY INJURY/Renal Failure -continue Foley Catheter-assess need -Avoid nephrotoxic  agents -Follow urine output, BMP -Ensure adequate renal perfusion, optimize oxygenation -Renal dose medications HD as needed   NEUROLOGY - intubated and sedated - minimal sedation to achieve a RASS goal: -1  CARDIAC ICU monitoring      GI GI PROPHYLAXIS as indicated  NUTRITIONAL STATUS DIET-->TF's as tolerated Constipation protocol as indicated   ENDO - will use ICU hypoglycemic\Hyperglycemia protocol if indicated    ELECTROLYTES -follow labs as needed -replace as needed -pharmacy consultation and following   DVT/GI PRX ordered and assessed TRANSFUSIONS AS NEEDED MONITOR FSBS I Assessed the need for Labs I Assessed the need for Foley I Assessed the need for Central Venous Line Family Discussion when available I Assessed the need for Mobilization I made an Assessment of medications to be adjusted accordingly Safety Risk assessment completed  [place] ICU TEAM   Patient is DNR   Critical Care Time devoted to patient care services described in this note is 45 minutes.   Overall, patient is critically ill, prognosis is guarded.  Patient with Multiorgan failure and at high risk for cardiac arrest and death.    Corrin Parker, M.D.  [facility]  Medical Director [place] Director [facility] Cardio-Pulmonary Department

## 2020-06-29 NOTE — Progress Notes (Signed)
[facility]  ROUNDING NOTE   Subjective:     Remains critically ill Multiple family members at bedside Vent assisted. FiO2 28% Amiodarone Propofol Fentanyl GI: TF _0  cc/hr  01/10 0701 - 01/11 0700 In: 2419 [I.V.:1133.8; NG/GT:1235.3; IV Piggyback:50] Out: 1100 [Urine:1100]   Objective:  Vital signs in last 24 hours:  Temp:  [99.86 F (37.7 C)-100.76 F (38.2 C)] 100.22 F (37.9 C) (01/11 0600) Pulse Rate:  [76-102] 99 (01/11 0600) Resp:  [16-20] 16 (01/11 0115) BP: (115-151)/(57-77) 141/69 (01/11 0600) SpO2:  [91 %-100 %] 91 % (01/11 0600) FiO2 (%):  [28 %] 28 % (01/11 0500)  Weight change:  Filed Weights   06/26/20 0353 06/27/20 0314 06/28/20 0407  Weight: 57 kg 63.7 kg 64.1 kg    Intake/Output: I/O last 3 completed shifts: In: 3516.6 [I.V.:1656.4; NG/GT:1760.3; IV Piggyback:99.9] Out: 1800 [Urine:1800]   Intake/Output this shift:  Total I/O In: 42.8 [I.V.:42.8] Out: -    Physical Exam: General:  No acute distress, laying in the bed  HEENT  anicteric, moist oral mucous membrane  Pulm/lungs  ET tube in place, ventilator assisted  CVS/Heart  regular rhythm, no rub or gallop  Abdomen:   Soft, nontender  Extremities:  Trace peripheral edema  Neurologic:  Sedated  Skin:  No acute rashes  Right femoral temporary dialysis catheter placed 1/4 Dr. Delana Meyer     Basic Metabolic Panel: Recent Labs  Lab 06/24/20 0326 06/25/20 0629 06/26/20 0500 06/27/20 0319 06/28/20 0245 06/29/20 0425  NA 137 140 140 138 138  --   K 3.9 3.7 3.4* 3.9 3.9  --   CL 100 102 102 101 96*  --   CO2 _1 --   GLUCOSE 206* 177* 143* 176* 182*  --   BUN 46* 37* 62* 81* 96*  --   CREATININE 3.68* 2.62* 3.72* 4.37* 4.75*  --   CALCIUM 7.9* 8.0* 7.5* 7.1* 7.4*  --   MG 2.2 2.1 1.9 1.9 2.0 1.7  PHOS 5.2* 3.5 1.7* 3.7 4.9* 2.8    Liver Function Tests: No results for input(s): AST, ALT, ALKPHOS, BILITOT, PROT, ALBUMIN in the last 168 hours. No results  for input(s): LIPASE, AMYLASE in the last 168 hours. No results for input(s): AMMONIA in the last 168 hours.  CBC: Recent Labs  Lab 06/25/20 0629 06/26/20 0500 06/27/20 0319 06/28/20 0245 06/29/20 0425  WBC 18.3* 17.5* 17.9* 17.7* 16.1*  HGB 8.2* 8.3* 7.5* 7.9* 7.5*  HCT 25.9* 25.7* 23.5* 23.5* 22.7*  MCV 95.9 95.5 94.0 91.1 90.8  PLT 277 263 214 231 260    Cardiac Enzymes: No results for input(s): CKTOTAL, CKMB, CKMBINDEX, TROPONINI in the last 168 hours.  BNP: Invalid input(s): POCBNP  CBG: Recent Labs  Lab 06/28/20 1700 06/28/20 1916 06/29/20 0014 06/29/20 0414 06/29/20 0756  GLUCAP 168* 157* 124* 151* 122*    Microbiology: Results for orders placed or performed during the hospital encounter of 06/19/2020  Resp Panel by RT-PCR (Flu A&B, Covid) Nasopharyngeal Swab     Status: None   Collection Time: 07/06/2020  6:43 PM   Specimen: Nasopharyngeal Swab; Nasopharyngeal(NP) swabs in vial transport medium  Result Value Ref Range Status   SARS Coronavirus 2 by RT PCR NEGATIVE NEGATIVE Final    Comment: (NOTE) SARS-CoV-2 target nucleic acids are NOT DETECTED.  The SARS-CoV-2 RNA is generally detectable in upper respiratory specimens during the acute phase of infection. The lowest concentration of SARS-CoV-2 viral copies this assay can  detect is 138 copies/mL. A negative result does not preclude SARS-Cov-2 infection and should not be used as the sole basis for treatment or other patient management decisions. A negative result may occur with  improper specimen collection/handling, submission of specimen other than nasopharyngeal swab, presence of viral mutation(s) within the areas targeted by this assay, and inadequate number of viral copies(<138 copies/mL). A negative result must be combined with clinical observations, patient history, and epidemiological information. The expected result is Negative.  Fact Sheet for Patients:   EntrepreneurPulse.com.au  Fact Sheet for Healthcare Providers:  IncredibleEmployment.be  This test is no t yet approved or cleared by the [place] FDA and  has been authorized for detection and/or diagnosis of SARS-CoV-2 by FDA under an Emergency Use Authorization (EUA). This EUA will remain  in effect (meaning this test can be used) for the duration of the COVID-19 declaration under Section 564(b)(1) of the Act, 21 U.S.C.section 360bbb-3(b)(1), unless the authorization is terminated  or revoked sooner.       Influenza A by PCR NEGATIVE NEGATIVE Final   Influenza B by PCR NEGATIVE NEGATIVE Final    Comment: (NOTE) The Xpert Xpress SARS-CoV-2/FLU/RSV plus assay is intended as an aid in the diagnosis of influenza from Nasopharyngeal swab specimens and should not be used as a sole basis for treatment. Nasal washings and aspirates are unacceptable for Xpert Xpress SARS-CoV-2/FLU/RSV testing.  Fact Sheet for Patients: EntrepreneurPulse.com.au  Fact Sheet for Healthcare Providers: IncredibleEmployment.be  This test is not yet approved or cleared by the [place] FDA and has been authorized for detection and/or diagnosis of SARS-CoV-2 by FDA under an Emergency Use Authorization (EUA). This EUA will remain in effect (meaning this test can be used) for the duration of the COVID-19 declaration under Section 564(b)(1) of the Act, 21 U.S.C. section 360bbb-3(b)(1), unless the authorization is terminated or revoked.  Performed at [facility], [address], [place], [place] [address]   Blood Culture (routine x 2)     Status: None   Collection Time: 06/26/2020  6:43 PM   Specimen: BLOOD  Result Value Ref Range Status   Specimen Description BLOOD RIGHT ANTECUBITAL  Final   Special Requests   Final    BOTTLES DRAWN AEROBIC ONLY Blood Culture adequate volume   Culture   Final    NO GROWTH 5  DAYS Performed at [facility], [address], [place], [place] [address]    Report Status 06/26/2020 FINAL  Final  Blood Culture (routine x 2)     Status: Abnormal   Collection Time: 07/19/2020  6:43 PM   Specimen: BLOOD  Result Value Ref Range Status   Specimen Description   Final    BLOOD LEFT ANTECUBITAL Performed at [facility], [address], [place], [place] [address]    Special Requests   Final    BOTTLES DRAWN AEROBIC ONLY Blood Culture adequate volume Performed at [facility], [address] [place], [place] [address]    Culture  Setup Time   Final    GRAM POSITIVE RODS AEROBIC BOTTLE ONLY CRITICAL RESULT CALLED TO, READ BACK BY AND VERIFIED WITH: MYRA FLOWERS AT 3329 ON 06/26/2020 [place]. Performed at [facility], [address] [place], [place] [address]    Culture (A)  Final    ACTINOMYCES ODONTOLYTICUS Standardized susceptibility testing for this organism is not available. Performed at [facility], [place] [address], [place],  [address]    Report Status 06/29/2020  FINAL  Final  Resp Panel by RT-PCR (Flu A&B, Covid) Nasopharyngeal Swab     Status: None   Collection Time: 07/07/2020  9:39 PM   Specimen: Nasopharyngeal Swab; Nasopharyngeal(NP) swabs in vial transport medium  Result Value Ref Range Status   SARS Coronavirus 2 by RT PCR NEGATIVE NEGATIVE Final    Comment: (NOTE) SARS-CoV-2 target nucleic acids are NOT DETECTED.  The SARS-CoV-2 RNA is generally detectable in upper respiratory specimens during the acute phase of infection. The lowest concentration of SARS-CoV-2 viral copies this assay can detect is 138 copies/mL. A negative result does not preclude SARS-Cov-2 infection and should not be used as the sole basis for treatment or other patient management decisions. A negative result may occur with  improper specimen collection/handling, submission of specimen other than nasopharyngeal swab, presence of  viral mutation(s) within the areas targeted by this assay, and inadequate number of viral copies(<138 copies/mL). A negative result must be combined with clinical observations, patient history, and epidemiological information. The expected result is Negative.  Fact Sheet for Patients:  EntrepreneurPulse.com.au  Fact Sheet for Healthcare Providers:  IncredibleEmployment.be  This test is no t yet approved or cleared by the [place] FDA and  has been authorized for detection and/or diagnosis of SARS-CoV-2 by FDA under an Emergency Use Authorization (EUA). This EUA will remain  in effect (meaning this test can be used) for the duration of the COVID-19 declaration under Section 564(b)(1) of the Act, 21 U.S.C.section 360bbb-3(b)(1), unless the authorization is terminated  or revoked sooner.       Influenza A by PCR NEGATIVE NEGATIVE Final   Influenza B by PCR NEGATIVE NEGATIVE Final    Comment: (NOTE) The Xpert Xpress SARS-CoV-2/FLU/RSV plus assay is intended as an aid in the diagnosis of influenza from Nasopharyngeal swab specimens and should not be used as a sole basis for treatment. Nasal washings and aspirates are unacceptable for Xpert Xpress SARS-CoV-2/FLU/RSV testing.  Fact Sheet for Patients: EntrepreneurPulse.com.au  Fact Sheet for Healthcare Providers: IncredibleEmployment.be  This test is not yet approved or cleared by the [place] FDA and has been authorized for detection and/or diagnosis of SARS-CoV-2 by FDA under an Emergency Use Authorization (EUA). This EUA will remain in effect (meaning this test can be used) for the duration of the COVID-19 declaration under Section 564(b)(1) of the Act, 21 U.S.C. section 360bbb-3(b)(1), unless the authorization is terminated or revoked.  Performed at [facility], [address] [place], [place] [address]   MRSA PCR Screening      Status: None   Collection Time: 06/23/20  2:45 AM   Specimen: Nasopharyngeal  Result Value Ref Range Status   MRSA by PCR NEGATIVE NEGATIVE Final    Comment:        The GeneXpert MRSA Assay (FDA approved for NASAL specimens only), is one component of a comprehensive MRSA colonization surveillance program. It is not intended to diagnose MRSA infection nor to guide or monitor treatment for MRSA infections. Performed at [facility], [address] [place], [place] [address]     Coagulation Studies: Recent Labs    06/26/20 September 13, 2043  LABPROT 14.2  INR 1.1    Urinalysis: No results for input(s): COLORURINE, LABSPEC, PHURINE, GLUCOSEU, HGBUR, BILIRUBINUR, KETONESUR, PROTEINUR, UROBILINOGEN, NITRITE, LEUKOCYTESUR in the last 72 hours.  Invalid input(s): APPERANCEUR    Imaging: No results found.   Medications:   . amiodarone 30 mg/hr (06/29/20 0708)  . famotidine (PEPCID) IV Stopped (06/28/20 2050-09-13)  .  feeding supplement (VITAL AF 1.2 CAL) 45 mL/hr at 06/28/20 1900  . fentaNYL infusion INTRAVENOUS 100 mcg/hr (06/29/20 0708)  . heparin 1,550 Units/hr (06/29/20 0708)  . propofol (DIPRIVAN) infusion Stopped (06/29/20 0559)   . aspirin  81 mg Per Tube Daily  . chlorhexidine gluconate (MEDLINE KIT)  15 mL Mouth Rinse BID  . Chlorhexidine Gluconate Cloth  6 each Topical Q0600  . docusate  100 mg Per Tube BID  . epoetin (EPOGEN/PROCRIT) injection  10,000 Units Intravenous Q T,Th,Sa-HD  . feeding supplement (PROSource TF)  45 mL Per Tube Daily  . furosemide  40 mg Intravenous BID  . hydrALAZINE  25 mg Per Tube BID  . insulin aspart  0-20 Units Subcutaneous Q4H  . mouth rinse  15 mL Mouth Rinse 10 times per day  . multivitamin  15 mL Per Tube Daily  . polyethylene glycol  17 g Per Tube Daily   acetaminophen **OR** acetaminophen, fentaNYL, haloperidol lactate, ipratropium-albuterol, labetalol, LORazepam, ondansetron (ZOFRAN) IV, vecuronium  Assessment/ Plan:  Ms.  Orlena Garmon Jayle Solarz is a 65 y.o. 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   1. End stage renal disease from progression of chronic kidney disease secondary to nephrotic syndrome from FSGS collapsing variant biopsy 03/2020.  Followed by [facility].  Outpatient baseline creatinine of 4.75/GFR 9 in October 2021   1/10-Underwent HD  1/11- no indication of HD. UOP 1100 cc  2. Acute respiratory failure with pulmonary edema requring intubation and mechanical ventilation.  Ventilator assisted, FiO2 28% Failure to wean  3. Anemia with chronic kidney disease:  Lab Results  Component Value Date   HGB 7.5 (L) 06/29/2020    - EPO with dialysis   LOS: 8 Yasuo Phimmasone 1/11/20229:15 AM

## 2020-06-30 ENCOUNTER — Inpatient Hospital Stay: Payer: Medicaid Other

## 2020-06-30 DIAGNOSIS — K117 Disturbances of salivary secretion: Secondary | ICD-10-CM

## 2020-06-30 DIAGNOSIS — R0600 Dyspnea, unspecified: Secondary | ICD-10-CM

## 2020-06-30 LAB — CBC
HCT: 20.9 % — ABNORMAL LOW (ref 36.0–46.0)
Hemoglobin: 7 g/dL — ABNORMAL LOW (ref 12.0–15.0)
MCH: 30.2 pg (ref 26.0–34.0)
MCHC: 33.5 g/dL (ref 30.0–36.0)
MCV: 90.1 fL (ref 80.0–100.0)
Platelets: 298 K/uL (ref 150–400)
RBC: 2.32 MIL/uL — ABNORMAL LOW (ref 3.87–5.11)
RDW: 15.1 % (ref 11.5–15.5)
WBC: 19.1 K/uL — ABNORMAL HIGH (ref 4.0–10.5)
nRBC: 0 % (ref 0.0–0.2)

## 2020-06-30 LAB — GLUCOSE, CAPILLARY
Glucose-Capillary: 185 mg/dL — ABNORMAL HIGH (ref 70–99)
Glucose-Capillary: 172 mg/dL — ABNORMAL HIGH (ref 70–99)

## 2020-06-30 LAB — HEPARIN LEVEL (UNFRACTIONATED): Heparin Unfractionated: 0.28 IU/mL — ABNORMAL LOW (ref 0.30–0.70)

## 2020-06-30 LAB — TRIGLYCERIDES: Triglycerides: 210 mg/dL — ABNORMAL HIGH (ref <150)

## 2020-06-30 LAB — MAGNESIUM: Magnesium: 1.8 mg/dL (ref 1.7–2.4)

## 2020-06-30 LAB — PHOSPHORUS: Phosphorus: 3.5 mg/dL (ref 2.5–4.6)

## 2020-06-30 MED ORDER — SCOPOLAMINE 1 MG/3DAYS TD PT72: 1.0000 | MEDICATED_PATCH | TRANSDERMAL | Status: DC

## 2020-06-30 MED ORDER — HYDROMORPHONE HCL 1 MG/ML IJ SOLN
INTRAMUSCULAR | Status: AC
  Administered 2020-06-30: 0.5 mg via INTRAVENOUS
  Filled 2020-06-30: qty 4

## 2020-06-30 MED ORDER — INSULIN ASPART 100 UNIT/ML ~~LOC~~ SOLN
SUBCUTANEOUS | Status: AC
  Filled 2020-06-30: qty 1

## 2020-06-30 MED ORDER — LORAZEPAM 2 MG/ML IJ SOLN
INTRAMUSCULAR | Status: AC
  Filled 2020-06-30: qty 1

## 2020-06-30 MED ORDER — HYDROMORPHONE HCL 1 MG/ML IJ SOLN
INTRAMUSCULAR | Status: AC
  Administered 2020-06-30: 0.5 mg via INTRAVENOUS
  Filled 2020-06-30: qty 1

## 2020-06-30 MED ORDER — SODIUM CHLORIDE FLUSH 0.9 % IV SOLN
INTRAVENOUS | Status: AC
  Administered 2020-06-30: 10 mL
  Filled 2020-06-30: qty 60

## 2020-06-30 MED ORDER — SODIUM CHLORIDE FLUSH 0.9 % IV SOLN
INTRAVENOUS | Status: AC
  Administered 2020-06-30: 10 mL
  Filled 2020-06-30: qty 10

## 2020-06-30 MED ORDER — SODIUM CHLORIDE 0.9 % IV SOLN
1.0000 mg/h | INTRAVENOUS | Status: DC
  Filled 2020-06-30: qty 5

## 2020-06-30 MED ORDER — GLYCOPYRROLATE 0.2 MG/ML IJ SOLN
0.3000 mg | INTRAMUSCULAR | Status: AC
  Administered 2020-06-30: 0.3 mg via INTRAVENOUS
  Filled 2020-06-30: qty 2

## 2020-06-30 MED ORDER — HYDROMORPHONE HCL 1 MG/ML IJ SOLN: 0.5000 mg | Freq: Once | INTRAMUSCULAR | Status: AC

## 2020-06-30 MED ORDER — GLYCOPYRROLATE 0.2 MG/ML IJ SOLN: 0.2000 mg | INTRAMUSCULAR | Status: DC | PRN

## 2020-06-30 MED ORDER — LORAZEPAM 2 MG/ML IJ SOLN
1.0000 mg | INTRAMUSCULAR | Status: AC
  Administered 2020-06-30: 1 mg via INTRAVENOUS

## 2020-06-30 MED ORDER — HYDROMORPHONE HCL 1 MG/ML IJ SOLN
0.5000 mg | INTRAMUSCULAR | Status: DC | PRN
  Administered 2020-06-30: 0.5 mg via INTRAVENOUS

## 2020-06-30 MED ORDER — HYDROMORPHONE BOLUS VIA INFUSION
0.5000 mg | INTRAVENOUS | Status: DC | PRN
  Filled 2020-06-30: qty 1

## 2020-06-30 MED ORDER — HYDROMORPHONE HCL 1 MG/ML IJ SOLN: 0.5000 mg | INTRAMUSCULAR | Status: AC

## 2020-06-30 MED ORDER — FENTANYL BOLUS VIA INFUSION
50.0000 ug | INTRAVENOUS | Status: DC | PRN
  Administered 2020-06-30 (×3): 100 ug via INTRAVENOUS
  Filled 2020-06-30: qty 100

## 2020-07-01 LAB — GLUCOSE, CAPILLARY: Glucose-Capillary: 26 mg/dL — CL (ref 70–99)

## 2020-07-20 NOTE — Death Summary Note (Signed)
DEATH SUMMARY   Patient Details  Name: Stephanie Casey MRN: [id number] DOB: 01-20-1956  Admission/Discharge Information   Admit Date:  07-21-2020  Date of Death:   2020/07/30   Time of Death:  1035AM  Length of Stay: 9  Referring Physician: Freddy Finner, NP   Reason(s) for Hospitalization  ISCHEMIC CARDIOMYOPATHY  Diagnoses  Preliminary cause of death: ISCHEMIC CARDIOMYOPATHY Secondary Diagnoses (including complications and co-morbidities):  Principal Problem:   Acute respiratory failure with hypoxia ([place]) Active Problems:   Acute heart failure with preserved ejection fraction (HFpEF) ([place])   Hypertension   Diabetes mellitus without complication (HCC)   ARF (acute renal failure) (HCC)   SOB (shortness of breath)   Elevated troponin   Acute renal failure (ARF) (HCC)   FSGS (focal segmental glomerulosclerosis)   Brief Hospital Course (including significant findings, care, treatment, and services provided and events leading to death)  65 y.o. 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 Failed trial of BiPAP in ED requiring emergent intubation.  1/4: Right Femoral Trialysis placed by Vascular Surgery  Significant Diagnostic Tests:  1/3: CXR>>Moderate to marked severity predominantly bilateral perihilar, bilateral suprahilar and bilateral infrahilar infiltrates are seen. Extension to involve the bilateral lung bases is also noted. There are small bilateral pleural effusions. No pneumothorax is identified. The cardiac silhouette is moderately enlarged. The visualized skeletal structures are unremarkable. 1/3: Renal US>>Atrophic and echogenic bilateral kidneys suggesting renal parenchymal disease with an underlying mass within the left kidney not excluded. Recommend MRI or CT renal protocol for further evaluation.  Micro Data:  1/3: SARS-CoV-2 PCR>>negative 1/3:  Influenza A&B PCR>>negative 1/3: Blood culture x2>> 1/4: HIV Screen>>nonreactive 1/4: Hepatitis B Surface Ag>>nonreactive 1/4:Hepatitis B S ab>>nonreactive 1/4: Hepatitis B core Ab, IgM>>nonreactive 1/4: Hepatitis C Ab>>nonreactive      1/4 admitted to ICU for acute pulm edema 1/5 remains on vent did NOT tolerate HD due to hypotension 1/6 self extubated-on biPAP 1/6 re intubated emergently 1/7 Met with palliative and made DNR 1/10 DNR, patient with severe heart failure,+renal failure    GOALS OF CARE DISCUSSION  The Clinical status was relayed to family in detail.  Patient is having a weak cough and struggling to remove secretions.   patient with increased WOB and using accessory muscles to breathe Explained to family course of therapy and the modalities     Patient with Progressive multiorgan failure with very low chance of meaningful recovery despite all aggressive and optimal medical therapy.  Family understands the situation.  They have consented and agreed to DNR/DNI and proceeded with one way extubation. Patient subsequently passed away almost immediately after extubation. All family members were at bedside    Family are satisfied with Plan of action and management. All questions answered     Pertinent Labs and Studies  Significant Diagnostic Studies DG Chest 1 View  Result Date: 06/23/2020 CLINICAL DATA:  Short of breath EXAM: CHEST  1 VIEW COMPARISON:  Jul 21, 2020 FINDINGS: Single frontal view of the chest demonstrates persistent enlargement the cardiac silhouette. Worsening bilateral perihilar airspace disease. Small effusions have enlarged. No pneumothorax. IMPRESSION: 1. Findings consistent with progressive congestive heart failure and pulmonary edema. Electronically Signed   By: Randa Ngo M.D.   On: 06/25/2020 03:59   DG Chest 2 View  Result Date: 2020-07-21 CLINICAL DATA:  Chest pressure, shortness of breath and intermittent nausea. EXAM: CHEST  - 2 VIEW COMPARISON:  June 26, 2010 FINDINGS: Moderate to marked severity predominantly bilateral perihilar, bilateral suprahilar and bilateral infrahilar infiltrates are seen. Extension to involve the bilateral lung bases is also noted. There are small bilateral pleural effusions. No pneumothorax is identified. The cardiac silhouette is moderately enlarged. The visualized skeletal structures are unremarkable. IMPRESSION: 1. Moderate to marked severity bilateral infiltrates. 2. Small bilateral pleural effusions. Electronically Signed   By: Virgina Norfolk M.D.   On: 07/03/2020 15:28   DG Abd 1 View  Result Date: 2020/07/12 CLINICAL DATA:  NG tube placement EXAM: ABDOMEN - 1 VIEW COMPARISON:  06/24/2020 FINDINGS: NG tube is in place with the tip in the mid to distal stomach. IMPRESSION: NG tube in the stomach. Electronically Signed   By: Rolm Baptise M.D.   On: 07/12/2020 01:21   DG Abd 1 View  Result Date: 06/24/2020 CLINICAL DATA:  Gastric tube placement EXAM: ABDOMEN - 1 VIEW COMPARISON:  07/07/2020 FINDINGS: Gastric tube in the body of the stomach unchanged in position from the prior study. Normal bowel gas pattern Right femoral vascular catheter overlying the L4-5 level. IMPRESSION: Gastric tube in the body the stomach unchanged. Normal bowel gas pattern. Electronically Signed   By: Franchot Gallo M.D.   On: 06/24/2020 12:45   DG Abd 1 View  Result Date: 07/16/2020 CLINICAL DATA:  OG tube EXAM: ABDOMEN - 1 VIEW COMPARISON:  None. FINDINGS: Esophageal tube tip and side port overlie the proximal to mid stomach. Small bilateral effusions. Perihilar airspace disease. IMPRESSION: Esophageal tube tip and side port overlie the proximal to mid stomach. Electronically Signed   By: Donavan Foil M.D.   On: 06/23/2020 20:31   CT HEAD WO CONTRAST  Result Date: 06/23/2020 CLINICAL DATA:  Acute hypoxic respiratory failure, acute renal insufficiency, altered level of consciousness, intubated EXAM: CT HEAD  WITHOUT CONTRAST TECHNIQUE: Contiguous axial images were obtained from the base of the skull through the vertex without intravenous contrast. COMPARISON:  06/26/2010 FINDINGS: Brain: No acute infarct or hemorrhage. Lateral ventricles and midline structures are unremarkable. No acute extra-axial fluid collections. No mass effect. Vascular: Diffuse atherosclerosis of the internal carotid arteries. No hyperdense vessel. Skull: Normal. Negative for fracture or focal lesion. Sinuses/Orbits: No acute finding. Other: None. IMPRESSION: 1. No acute intracranial process. Electronically Signed   By: Randa Ngo M.D.   On: 06/23/2020 21:53   US Renal  Result Date: 06/26/2020 CLINICAL DATA:  Acute renal failure. EXAM: RENAL / URINARY TRACT ULTRASOUND COMPLETE COMPARISON:  Ultrasound abdomen 12/29/2012, ultrasound renal 04/25/1999 FINDINGS: Right Kidney: Renal measurements: 7.4 x 3 x 4.1 cm = volume: 85 mL. Echogenicity is increased. No mass or hydronephrosis visualized. Left Kidney: Renal measurements: 8.8 x 4.6 x 4.3 cm = volume: 91 mL. Echogenicity is increased. Underlying mass within the left kidney cannot be excluded. Urinary bladder: Appears normal for degree of bladder distention. Other: None. IMPRESSION: Atrophic and echogenic bilateral kidneys suggesting renal parenchymal disease with an underlying mass within the left kidney not excluded. Recommend MRI or CT renal protocol for further evaluation. Electronically Signed   By: Iven Finn M.D.   On: 07/03/2020 21:23   DG Chest Port 1 View  Result Date: 06/27/2020 CLINICAL DATA:  Endotracheal intubation EXAM: PORTABLE CHEST 1 VIEW COMPARISON:  Radiograph 06/24/2020 FINDINGS: Endotracheal tube terminates 3 cm from the carina. Transesophageal tube tip and side port terminate distal to the GE junction, beyond the margins of imaging. Telemetry leads and external support devices overlie the chest. Diffuse bilateral airspace disease with gradient  attenuation in both  lung bases, right greater than left as well as right fissural thickening suggestive of fluid tracking along the right fissures. No pneumothorax. Grossly stable stable cardiomediastinal contours accounting for differences in technique. The aorta is calcified. The remaining cardiomediastinal contours are unremarkable. No acute osseous or soft tissue abnormality. IMPRESSION: Endotracheal tube 3 cm from the carina. Transesophageal tube tip and side port terminate distal to the GE junction. Persistent bilateral heterogeneous opacities. Likely increase in the volume of layering pleural effusions including fluid likely tracking in the right fissures. Electronically Signed   By: Lovena Le M.D.   On: 06/27/2020 04:23   DG Chest Port 1 View  Result Date: 06/24/2020 CLINICAL DATA:  Endotracheal tube.  Respiratory failure EXAM: PORTABLE CHEST 1 VIEW COMPARISON:  06/24/2020 FINDINGS: Endotracheal tube tip approximately 2 cm above the carina. Gastric tube extends into the stomach. Diffuse bilateral airspace disease. Mild improvement on the right. No change on the left. This is most prominent in the perihilar and lower lung zones. Probable small left effusion unchanged. IMPRESSION: Endotracheal tube in satisfactory position Diffuse bilateral airspace disease with mild improvement on the right. Electronically Signed   By: Franchot Gallo M.D.   On: 06/24/2020 12:48   DG Chest Port 1 View  Result Date: 06/24/2020 CLINICAL DATA:  Hypoxia EXAM: PORTABLE CHEST 1 VIEW COMPARISON:  June 23, 2020. FINDINGS: Endotracheal tube tip is 3.5 cm above the carina. Nasogastric tube tip and side port are below the diaphragm. No pneumothorax. There has been significant clearing of airspace opacity from the left upper lobe and mid lung region with mild residual opacity in the left upper and mid lung regions remaining. Airspace opacity has partially but incompletely cleared from the right upper lobe. There is fairly extensive airspace  opacity in the right mid and lower lung regions. There is atelectatic change in the left lower lobe, slightly increased. Heart is upper normal in size with pulmonary vascularity normal. No adenopathy. There is aortic atherosclerosis. No bone lesions. IMPRESSION: Tube positions as described without pneumothorax. Multifocal airspace opacity with areas of partial clearing, particularly in the right upper lobe and left upper lobe and mid lung regions. More extensive opacity remains in the right mid lung and right base regions. Stable cardiac silhouette. Aortic Atherosclerosis (ICD10-I70.0). Electronically Signed   By: Lowella Grip III M.D.   On: 06/24/2020 10:32   DG Chest Port 1 View  Result Date: 06/23/2020 CLINICAL DATA:  Acute respiratory failure, hypoxia EXAM: PORTABLE CHEST 1 VIEW COMPARISON:  07/17/2020 FINDINGS: Endotracheal tube is seen 2.6 cm above the carina. Nasogastric tube extends into the upper abdomen beyond the margin of the examination. Esophageal Doppler probe appears to overlie the expected right para form sinus. Extensive bilateral perihilar and right basilar airspace infiltrate has improved slightly in the interval since prior examination. No pneumothorax or pleural effusion. Cardiac size within normal limits. IMPRESSION: Esophageal Doppler probe overlies the expected right piriform sinus. Endotracheal tube and nasogastric tube in expected position. Improving extensive multifocal pulmonary infiltrate. Electronically Signed   By: Fidela Salisbury MD   On: 06/23/2020 05:14   DG Chest Portable 1 View  Result Date: 06/24/2020 CLINICAL DATA:  Intubated EXAM: PORTABLE CHEST 1 VIEW COMPARISON:  07/05/2020, 06/26/2020, 07/17/2020 FINDINGS: Interval intubation, tip of the endotracheal tube is about a cm superior to the carina. Esophageal tube tip below the diaphragm but incompletely visualized. Perihilar consolidations and airspace disease without significant change. Probable right pleural  effusion. Basilar airspace disease. Stable  cardiomediastinal silhouette. IMPRESSION: Interval intubation with tip of the endotracheal tube about a cm superior to the carina. No significant interval change in bilateral perihilar and basilar airspace disease which may reflect edema or pneumonia. Electronically Signed   By: Donavan Foil M.D.   On: 07/03/2020 20:30   DG Chest Portable 1 View  Result Date: 07/05/2020 CLINICAL DATA:  65 year old female with decreased O2 saturation. EXAM: PORTABLE CHEST 1 VIEW COMPARISON:  Chest radiograph dated 06/27/2020. FINDINGS: Cardiomegaly with vascular congestion and edema. Pneumonia is not excluded clinical correlation is recommended. Overall progression of pulmonary opacity on the right side. Probable small right pleural effusion. No pneumothorax. No acute osseous pathology. IMPRESSION: Cardiomegaly with findings of CHF. Pneumonia is not excluded. Electronically Signed   By: Anner Crete M.D.   On: 07/15/2020 20:06   ECHOCARDIOGRAM COMPLETE  Result Date: 07/11/2020    ECHOCARDIOGRAM REPORT   Patient Name:   Stephanie Casey Date of Exam: 07/18/2020 Medical Rec #:  [id number]             Height:       61.0 in Accession #:    [id number]            Weight:       145.8 lb Date of Birth:  04-27-1956            BSA:          1.651 m Patient Age:    41 years              BP:           113/96 mmHg Patient Gender: F                     HR:           99 bpm. Exam Location:  [facility] Procedure: 2D Echo, Color Doppler and Cardiac Doppler Indications:     I50.9 Congestive Heart Failure  History:         Patient has no prior history of Echocardiogram examinations.                  Risk Factors:Hypertension, Diabetes and Dyslipidemia.  Sonographer:     Charmayne Sheer RDCS (AE) Referring Phys:  [id number] Rhetta Mura Diagnosing Phys: Nelva Bush MD  Sonographer Comments: Image acquisition challenging due to patient behavioral factors., Image acquisition challenging due to  uncooperative patient and Image acquisition challenging due to respiratory motion. IMPRESSIONS  1. Left ventricular ejection fraction, by estimation, is 40 to 45%. The left ventricle has mildly decreased function. The left ventricle demonstrates global hypokinesis. The left ventricular internal cavity size was mildly dilated. There is mild left ventricular hypertrophy. Left ventricular diastolic parameters are indeterminate.  2. Right ventricular systolic function is normal. The right ventricular size is mildly enlarged. Mildly increased right ventricular wall thickness. There is moderately elevated pulmonary artery systolic pressure.  3. Left atrial size was mild to moderately dilated.  4. Right atrial size was moderately dilated.  5. A small pericardial effusion is present. The pericardial effusion is circumferential.  6. The mitral valve is abnormal. Mild to moderate mitral valve regurgitation. No evidence of mitral stenosis.  7. The tricuspid valve is degenerative. Tricuspid valve regurgitation is severe.  8. The aortic valve has an indeterminant number of cusps. There is mild thickening of the aortic valve. Aortic valve regurgitation is not visualized. No aortic stenosis is present.  9. The inferior vena cava is dilated  in size with <50% respiratory variability, suggesting right atrial pressure of 15 mmHg. FINDINGS  Left Ventricle: Left ventricular ejection fraction, by estimation, is 40 to 45%. The left ventricle has mildly decreased function. The left ventricle demonstrates global hypokinesis. The left ventricular internal cavity size was mildly dilated. There is  mild left ventricular hypertrophy. Left ventricular diastolic parameters are indeterminate. Right Ventricle: The right ventricular size is mildly enlarged. Mildly increased right ventricular wall thickness. Right ventricular systolic function is normal. There is moderately elevated pulmonary artery systolic pressure. The tricuspid regurgitant  velocity is 3.09 m/s, and with an assumed right atrial pressure of 15 mmHg, the estimated right ventricular systolic pressure is 68.1 mmHg. Left Atrium: Left atrial size was mild to moderately dilated. Right Atrium: Right atrial size was moderately dilated. Pericardium: A small pericardial effusion is present. The pericardial effusion is circumferential. Mitral Valve: The mitral valve is abnormal. There is mild thickening of the mitral valve leaflet(s). Mild to moderate mitral valve regurgitation. No evidence of mitral valve stenosis. MV peak gradient, 6.2 mmHg. The mean mitral valve gradient is 4.0 mmHg. Tricuspid Valve: The tricuspid valve is degenerative in appearance. Tricuspid valve regurgitation is severe. Aortic Valve: The aortic valve has an indeterminant number of cusps. There is mild thickening of the aortic valve. Aortic valve regurgitation is not visualized. No aortic stenosis is present. Aortic valve mean gradient measures 6.0 mmHg. Aortic valve peak gradient measures 10.9 mmHg. Aortic valve area, by VTI measures 2.36 cm. Pulmonic Valve: The pulmonic valve was grossly normal. Pulmonic valve regurgitation is trivial. No evidence of pulmonic stenosis. Aorta: The aortic root is normal in size and structure. Pulmonary Artery: The pulmonary artery is of normal size. Venous: The inferior vena cava is dilated in size with less than 50% respiratory variability, suggesting right atrial pressure of 15 mmHg. IAS/Shunts: The interatrial septum was not well visualized. Additional Comments: There is pleural effusion in the left lateral region.  LEFT VENTRICLE PLAX 2D LVIDd:         5.40 cm  Diastology LVIDs:         3.80 cm  LV e' lateral:   8.81 cm/s LV PW:         1.20 cm  LV E/e' lateral: 15.0 LV IVS:        1.18 cm LVOT diam:     2.20 cm LV SV:         71 LV SV Index:   43 LVOT Area:     3.80 cm  RIGHT VENTRICLE RV Basal diam:  4.10 cm LEFT ATRIUM           Index       RIGHT ATRIUM           Index LA diam:       4.80 cm 2.91 cm/m  RA Area:     25.90 cm LA Vol (A4C): 89.7 ml 54.32 ml/m RA Volume:   90.90 ml  55.05 ml/m  AORTIC VALVE                    PULMONIC VALVE AV Area (Vmax):    2.26 cm     PV Vmax:       1.26 m/s AV Area (Vmean):   2.11 cm     PV Vmean:      79.200 cm/s AV Area (VTI):     2.36 cm     PV VTI:        0.202 m AV  Vmax:           165.00 cm/s  PV Peak grad:  6.4 mmHg AV Vmean:          114.000 cm/s PV Mean grad:  3.0 mmHg AV VTI:            0.300 m AV Peak Grad:      10.9 mmHg AV Mean Grad:      6.0 mmHg LVOT Vmax:         97.90 cm/s LVOT Vmean:        63.200 cm/s LVOT VTI:          0.186 m LVOT/AV VTI ratio: 0.62  AORTA Ao Root diam: 3.20 cm MITRAL VALVE                TRICUSPID VALVE MV Area (PHT): 6.65 cm     TR Peak grad:   38.2 mmHg MV Peak grad:  6.2 mmHg     TR Vmax:        309.00 cm/s MV Mean grad:  4.0 mmHg MV Vmax:       1.25 m/s     SHUNTS MV Vmean:      91.3 cm/s    Systemic VTI:  0.19 m MV Decel Time: 114 msec     Systemic Diam: 2.20 cm MV E velocity: 132.00 cm/s MV A velocity: 106.00 cm/s MV E/A ratio:  1.25 Harrell Gave End MD Electronically signed by Nelva Bush MD Signature Date/Time: 07/17/2020/1:02:18 PM    Final     Microbiology Recent Results (from the past 240 hour(s))  Resp Panel by RT-PCR (Flu A&B, Covid) Nasopharyngeal Swab     Status: None   Collection Time: 06/25/2020  6:43 PM   Specimen: Nasopharyngeal Swab; Nasopharyngeal(NP) swabs in vial transport medium  Result Value Ref Range Status   SARS Coronavirus 2 by RT PCR NEGATIVE NEGATIVE Final    Comment: (NOTE) SARS-CoV-2 target nucleic acids are NOT DETECTED.  The SARS-CoV-2 RNA is generally detectable in upper respiratory specimens during the acute phase of infection. The lowest concentration of SARS-CoV-2 viral copies this assay can detect is 138 copies/mL. A negative result does not preclude SARS-Cov-2 infection and should not be used as the sole basis for treatment or other patient management  decisions. A negative result may occur with  improper specimen collection/handling, submission of specimen other than nasopharyngeal swab, presence of viral mutation(s) within the areas targeted by this assay, and inadequate number of viral copies(<138 copies/mL). A negative result must be combined with clinical observations, patient history, and epidemiological information. The expected result is Negative.  Fact Sheet for Patients:  EntrepreneurPulse.com.au  Fact Sheet for Healthcare Providers:  IncredibleEmployment.be  This test is no t yet approved or cleared by the [place] FDA and  has been authorized for detection and/or diagnosis of SARS-CoV-2 by FDA under an Emergency Use Authorization (EUA). This EUA will remain  in effect (meaning this test can be used) for the duration of the COVID-19 declaration under Section 564(b)(1) of the Act, 21 U.S.C.section 360bbb-3(b)(1), unless the authorization is terminated  or revoked sooner.       Influenza A by PCR NEGATIVE NEGATIVE Final   Influenza B by PCR NEGATIVE NEGATIVE Final    Comment: (NOTE) The Xpert Xpress SARS-CoV-2/FLU/RSV plus assay is intended as an aid in the diagnosis of influenza from Nasopharyngeal swab specimens and should not be used as a sole basis for treatment. Nasal washings and aspirates are unacceptable for Xpert Xpress  SARS-CoV-2/FLU/RSV testing.  Fact Sheet for Patients: EntrepreneurPulse.com.au  Fact Sheet for Healthcare Providers: IncredibleEmployment.be  This test is not yet approved or cleared by the [place] FDA and has been authorized for detection and/or diagnosis of SARS-CoV-2 by FDA under an Emergency Use Authorization (EUA). This EUA will remain in effect (meaning this test can be used) for the duration of the COVID-19 declaration under Section 564(b)(1) of the Act, 21 U.S.C. section 360bbb-3(b)(1), unless the  authorization is terminated or revoked.  Performed at [facility], [address], [place], [place] [address]   Blood Culture (routine x 2)     Status: None   Collection Time: 06/29/2020  6:43 PM   Specimen: BLOOD  Result Value Ref Range Status   Specimen Description BLOOD RIGHT ANTECUBITAL  Final   Special Requests   Final    BOTTLES DRAWN AEROBIC ONLY Blood Culture adequate volume   Culture   Final    NO GROWTH 5 DAYS Performed at [facility], [address] [place], [place] [address]    Report Status 06/26/2020 FINAL  Final  Blood Culture (routine x 2)     Status: Abnormal   Collection Time: 06/20/2020  6:43 PM   Specimen: BLOOD  Result Value Ref Range Status   Specimen Description   Final    BLOOD LEFT ANTECUBITAL Performed at [facility], [address] [place], [place] [address]    Special Requests   Final    BOTTLES DRAWN AEROBIC ONLY Blood Culture adequate volume Performed at [facility], [address] [place], [place] [address]    Culture  Setup Time   Final    GRAM POSITIVE RODS AEROBIC BOTTLE ONLY CRITICAL RESULT CALLED TO, READ BACK BY AND VERIFIED WITH: MYRA FLOWERS AT [id number] ON 06/26/2020 [place]. Performed at [facility], [address] [place], [place] [address]    Culture (A)  Final    ACTINOMYCES ODONTOLYTICUS Standardized susceptibility testing for this organism is not available. Performed at [facility], [place] [address], [place], [place] [address]    Report Status 06/29/2020 FINAL  Final  Resp Panel by RT-PCR (Flu A&B, Covid) Nasopharyngeal Swab     Status: None   Collection Time: 06/27/2020  9:39 PM   Specimen: Nasopharyngeal Swab; Nasopharyngeal(NP) swabs in vial transport medium  Result Value Ref Range Status   SARS Coronavirus 2 by RT PCR NEGATIVE NEGATIVE Final    Comment: (NOTE) SARS-CoV-2 target nucleic acids are NOT DETECTED.  The SARS-CoV-2 RNA is generally detectable in upper  respiratory specimens during the acute phase of infection. The lowest concentration of SARS-CoV-2 viral copies this assay can detect is 138 copies/mL. A negative result does not preclude SARS-Cov-2 infection and should not be used as the sole basis for treatment or other patient management decisions. A negative result may occur with  improper specimen collection/handling, submission of specimen other than nasopharyngeal swab, presence of viral mutation(s) within the areas targeted by this assay, and inadequate number of viral copies(<138 copies/mL). A negative result must be combined with clinical observations, patient history, and epidemiological information. The expected result is Negative.  Fact Sheet for Patients:  EntrepreneurPulse.com.au  Fact Sheet for Healthcare Providers:  IncredibleEmployment.be  This test is no t yet approved or cleared by the [place] FDA and  has been authorized for detection and/or diagnosis of SARS-CoV-2 by FDA under an Emergency Use Authorization (EUA). This EUA will remain  in effect (meaning this test can  be used) for the duration of the COVID-19 declaration under Section 564(b)(1) of the Act, 21 U.S.C.section 360bbb-3(b)(1), unless the authorization is terminated  or revoked sooner.       Influenza A by PCR NEGATIVE NEGATIVE Final   Influenza B by PCR NEGATIVE NEGATIVE Final    Comment: (NOTE) The Xpert Xpress SARS-CoV-2/FLU/RSV plus assay is intended as an aid in the diagnosis of influenza from Nasopharyngeal swab specimens and should not be used as a sole basis for treatment. Nasal washings and aspirates are unacceptable for Xpert Xpress SARS-CoV-2/FLU/RSV testing.  Fact Sheet for Patients: EntrepreneurPulse.com.au  Fact Sheet for Healthcare Providers: IncredibleEmployment.be  This test is not yet approved or cleared by the [place] FDA and has been  authorized for detection and/or diagnosis of SARS-CoV-2 by FDA under an Emergency Use Authorization (EUA). This EUA will remain in effect (meaning this test can be used) for the duration of the COVID-19 declaration under Section 564(b)(1) of the Act, 21 U.S.C. section 360bbb-3(b)(1), unless the authorization is terminated or revoked.  Performed at [facility], [address] [place], [place] [address]   MRSA PCR Screening     Status: None   Collection Time: 06/23/20  2:45 AM   Specimen: Nasopharyngeal  Result Value Ref Range Status   MRSA by PCR NEGATIVE NEGATIVE Final    Comment:        The GeneXpert MRSA Assay (FDA approved for NASAL specimens only), is one component of a comprehensive MRSA colonization surveillance program. It is not intended to diagnose MRSA infection nor to guide or monitor treatment for MRSA infections. Performed at [facility], [address] [place], [place] [address]     Lab Basic Metabolic Panel: Recent Labs  Lab 06/24/20 [phone] 06/25/20 0629 06/26/20 0500 06/27/20 0319 06/28/20 0245 06/29/20 0425 07/23/2020 0433  NA 137 140 140 138 138  --   --   K 3.9 3.7 3.4* 3.9 3.9  --   --   CL 100 102 102 101 96*  --   --   CO2 _0 --   --   GLUCOSE 206* 177* 143* 176* 182*  --   --   BUN 46* 37* 62* 81* 96*  --   --   CREATININE 3.68* 2.62* 3.72* 4.37* 4.75*  --   --   CALCIUM 7.9* 8.0* 7.5* 7.1* 7.4*  --   --   MG 2.2 2.1 1.9 1.9 2.0 1.7 1.8  PHOS 5.2* 3.5 1.7* 3.7 4.9* 2.8 3.5   Liver Function Tests: No results for input(s): AST, ALT, ALKPHOS, BILITOT, PROT, ALBUMIN in the last 168 hours. No results for input(s): LIPASE, AMYLASE in the last 168 hours. No results for input(s): AMMONIA in the last 168 hours. CBC: Recent Labs  Lab 06/26/20 0500 06/27/20 0319 06/28/20 0245 06/29/20 0425 2020/07/23 0433  WBC 17.5* 17.9* 17.7* 16.1* 19.1*  HGB 8.3* 7.5* 7.9* 7.5* 7.0*  HCT 25.7* 23.5* 23.5* 22.7* 20.9*  MCV  95.5 94.0 91.1 90.8 90.1  PLT 263 214 231 260 298   Cardiac Enzymes: No results for input(s): CKTOTAL, CKMB, CKMBINDEX, TROPONINI in the last 168 hours. Sepsis Labs: Recent Labs  Lab 06/24/20 0326 06/25/20 0629 06/27/20 0319 06/28/20 0245 06/29/20 0425 2020/07/23 0433  PROCALCITON 34.68  --   --   --   --   --   WBC 15.9*   < > 17.9* 17.7* 16.1* 19.1*   < > = values in this  interval not displayed.       07-03-2020, 11:00 AM

## 2020-07-20 NOTE — Consult Note (Signed)
[place] for heparin infusion Indication: atrial fibrillation  Allergies  Allergen Reactions  . Hydrochlorothiazide     Pancreatitis    Patient Measurements: Height: 5' 0.98" (154.9 cm) Weight: 65.6 kg (144 lb 10 oz) IBW/kg (Calculated) : 47.76 Heparin Dosing Weight: 57 kg  Vital Signs: Temp: 98.7 F (37.1 C) (01/12 0518) Temp Source: Temporal (01/12 0518) BP: 121/63 (01/12 0700) Pulse Rate: 86 (01/12 0700)  Labs: Recent Labs    06/28/20 0245 06/28/20 1322 06/29/20 0425 06/29/20 1230 06/29/20 2309 07/10/20 0433 07-10-2020 0647  HGB 7.9*  --  7.5*  --   --  7.0*  --   HCT 23.5*  --  22.7*  --   --  20.9*  --   PLT 231  --  260  --   --  298  --   HEPARINUNFRC 0.16*   < >  --  0.36 0.32  --  0.28*  CREATININE 4.75*  --   --   --   --   --   --    < > = values in this interval not displayed.    Estimated Creatinine Clearance: 10.5 mL/min (A) (by C-G formula based on SCr of 4.75 mg/dL (H)).   Medical History: Past Medical History:  Diagnosis Date  . Diabetes mellitus without complication ([place])   . 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 Hyperlipidemia   . Hypertension      Assessment: 65 y.o. female admitted with acute hypoxic respiratory failure, AKI on CKD stage IV. Patient with new onset Afib. Pharmacy has been consulted for heparin infusion. Hemoglobin stable ~ 7.  CHADSVASc ~ 4.   1/9 0319 HL 0.26  1/9 1641 HL 0.13, subtherapeutic s/p 900 units bolus x 1 followed by  increased heparin infusion to 900 units/hr- both were given.  01/10 0245 HL 0.16, subtherapeutic 01/10 1322 HL 0.25, subtherapeutic 01/10 2230 HL 0.17 subtherapeutic 01/11 1230 HL 0.36, therapeutic x 1 01/11 2350 HL 0.32, therapeutic x2 01/12 0647 HL 0.28 increase heparin drip to 1700 units/hr.   Goal of Therapy:  Heparin level 0.3-0.7 units/ml Monitor platelets by anticoagulation protocol: Yes   Plan:  Heparin level is slightly  subtherapeutic. Will increase heparin drip to 1700 units/hr. Recheck HL in 8 hours. CBC daily while on heparin.    Eleonore Chiquito, PharmD,  Jul 10, 2020 8:10 AM

## 2020-07-20 NOTE — Progress Notes (Signed)
Pt was one way extubated. Placed on 4L [place] but immediately desat in 34s. Family at the bedside, emotional support given. Medication PRN was given as ordered per Palliative NP (at the bedside).   No lung or heart sounds noted. Pupils are dilated and fixed. Death pronounced by 2RNs, NP. Pt belongings returned to the family. Emotional cart is at the bedside. Family needed extra time to grief and say goodbye.

## 2020-07-20 NOTE — Progress Notes (Signed)
Daily Progress Note   Patient Name: Stephanie Casey       Date: July 18, 2020 DOB: [birth year]  Age: 66 y.o. MRN#: [id number] Attending Physician: Flora Lipps, MD Primary Care Physician: Freddy Finner, NP Admit Date: 06/23/2020  Reason for Consultation/Follow-up: Establishing goals of care  Subjective: Patient remains intubated. Opens eyes to voice, following simple commands. Does not appear to be in pain or discomfort.   GOC:  Discussed with Dr. Mortimer Fries and RN in detail prior to family meeting.   F/u with multiple family members at bedside along with [place] interpretor Coffee [phone]. Discussed in detail course of hospitalization including diagnoses, interventions, plan of care, and guarded prognosis with multiorgan failure. Plan is for one-way extubation this morning. Prepared family for one-way extubation explaining two scenarios, if she does well (breathing stable on her own) we will have further discussions about plan/hemodialysis. Also prepared family that she may decline following extubation, as she did previously this hospitalization requiring re-intubation. Explained that if she declines, we will make sure she is comfortable as she is nearing the end of her life. Allowed family time to process this information and ask questions. Husband shares he has a good understanding of plan and prognosis. He has visited her daily and understands how sick she is. Husband and family are prepared that she may decline following extubation. Reassured family that I will be at bedside to give medications as needed for comfort.   Instructed RN to give IV Dilaudid 0.67m x1 and IV Ativan 249mx1 prior to extubation. Patient comfortable prior to extubation. Extubated to 4L. Patient de-satted quickly off  ventilator. She appeared uncomfortable with tachypnea RR 50+, accessory muscle usage, and oropharyngeal secretions. This NP stayed at bedside with RN to assist with symptom management. Dilaudid IV 0.5x2, Ativan 29m44m1, and Robinul 0.3mg35m. Patient remained uncomfortable despite IVP doses. Restarted fentanyl gtt at bedside 200mc129mStayed with RN, patient, family at bedside. IPAD interpretor used again to explain to husband and family that she is declining quickly following extubation. Ensured that I am giving medications to keep her comfortable. Husband understands and appreciative of update. Patient actively dying. Appeared comfortable on fentanyl gtt as she passed. Expired 1034.   Appreciate nursing staff, interpretor, and chaplain providing support to this kind family.   Length of Stay:  9  Current Medications: Scheduled Meds:  . chlorhexidine gluconate (MEDLINE KIT)  15 mL Mouth Rinse BID  . epoetin (EPOGEN/PROCRIT) injection  10,000 Units Intravenous Q T,Th,Sa-HD  . hydrALAZINE  25 mg Per Tube BID  . mouth rinse  15 mL Mouth Rinse 10 times per day  . polyethylene glycol  17 g Per Tube Daily  . scopolamine  1 patch Transdermal Q72H    Continuous Infusions: . fentaNYL infusion INTRAVENOUS Stopped (19-Jul-2020 0846)    PRN Meds: acetaminophen **OR** acetaminophen, fentaNYL, glycopyrrolate, haloperidol lactate, HYDROmorphone (DILAUDID) injection, ipratropium-albuterol, labetalol, LORazepam, ondansetron (ZOFRAN) IV  Physical Exam Vitals and nursing note reviewed.  Constitutional:      Appearance: She is ill-appearing.     Interventions: She is intubated.  Cardiovascular:     Rate and Rhythm: Tachycardia present.  Pulmonary:     Effort: No tachypnea, accessory muscle usage or respiratory distress. She is intubated.     Breath sounds: Normal breath sounds.  Abdominal:     Tenderness: There is no abdominal tenderness.  Skin:    General: Skin is warm and dry.  Neurological:      Mental Status: She is easily aroused.     Comments: Opens eyes to voice, following simple commands.            Vital Signs: BP (!) 95/57   Pulse 84   Temp 98.6 F (37 C) (Tympanic)   Resp 16   Ht 5' 0.98" (1.549 m)   Wt 65.6 kg   SpO2 90%   BMI 27.34 kg/m  SpO2: SpO2: 90 % O2 Device: O2 Device: Ventilator O2 Flow Rate: O2 Flow Rate (L/min): 15 L/min  Intake/output summary:   Intake/Output Summary (Last 24 hours) at 07-19-2020 1035 Last data filed at 07-19-20 0900 Gross per 24 hour  Intake 2146.07 ml  Output 705 ml  Net 1441.07 ml   LBM: Last BM Date: 06/28/2020 Baseline Weight: Weight: 68 kg Most recent weight: Weight: 65.6 kg       Palliative Assessment/Data: PPS 30%    Flowsheet Rows   Flowsheet Row Most Recent Value  Intake Tab   Referral Department Critical care  Unit at Time of Referral ICU  Palliative Care Primary Diagnosis Pulmonary  Date Notified 06/25/20  Palliative Care Type New Palliative care  Reason for referral Clarify Goals of Care  Date of Admission 07/12/2020  Date first seen by Palliative Care 06/25/20  # of days Palliative referral response time 0 Day(s)  # of days IP prior to Palliative referral 4  Clinical Assessment   Psychosocial & Spiritual Assessment   Palliative Care Outcomes   Patient/Family meeting held? Yes  Who was at the meeting? spouse  Palliative Care Outcomes Clarified goals of care, Provided advance care planning, Provided psychosocial or spiritual support, Changed CPR status      Patient Active Problem List   Diagnosis Date Noted  . 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 Acute renal failure (ARF) ([place]) 06/29/2020  . Acute respiratory failure with hypoxia ([place]) 07/13/2020  . Acute heart failure with preserved ejection fraction (HFpEF) ([place]) 07/14/2020  . Hypertension   . Diabetes mellitus without complication ([place])   . ARF (acute renal failure) ([place])   . SOB (shortness of breath)   . Elevated troponin    . COVID-19 02/10/2019  . Intractable nausea and vomiting 02/09/2019    Palliative Care Assessment & Plan   Patient Profile: 65 y.o. 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 She failed trial of bipap and required intubation. She self-extubated 1/6 but required reintubation.  PMT consulted to discuss [place].  Assessment: Severe acute hypoxic and hypercapnic respiratory failure Pulmonary edema Acute systolic CHF and diastolic CHF exacerbation AKI on CKD, now ESRD requiring hemodialysis Anemia with chronic kidney disease  Recommendations/Plan:  F/u GOC with husband and multiple family members at bedside via interpretor. Discussed course of hospitalization, diagnoses, interventions, plan of care, guarded prognosis. Plan is for one-way extubation and monitor status. If decline, transition to comfort measures. Family understands and prepared.   **Patient quickly declined following extubation. Uncomfortable requiring multiple bolus via IV push and re-initiation of fentanyl infusion. Prepared family that she is declining quickly and anticipated she will pass in a short period of time. Reassured comfort and symptom management. Family was at bedside when patient passed. TOD 1034.  Code Status: DNR   Code Status Orders  (From admission, onward)         Start     Ordered   06/25/20 1350  Do not attempt resuscitation (DNR)  Continuous       Question Answer Comment  In the event of cardiac or respiratory ARREST Do not call a "code blue"   In the event of cardiac or respiratory ARREST Do not perform Intubation, CPR, defibrillation or ACLS   In the event of cardiac or respiratory ARREST Use medication by any route, position, wound care, and other measures to  relive pain and suffering. May use oxygen, suction and manual treatment of airway obstruction as needed for comfort.      06/25/20 1349        Code Status History    Date Active Date Inactive Code Status Order ID Comments User Context   06/24/2020 1959 06/25/2020 1349 Full Code [id number]  Rhetta Mura DO ED   02/09/2019 1835 02/10/2019 1719 Full Code [id number]  Mayo, Pete Pelt, MD Inpatient   Advance Care Planning Activity       Prognosis:  Died quickly following extubation  Discharge Planning:  Anticipated Hospital Death  Care plan was discussed with Dr. Mortimer Fries, RN, husband, multiple family members at bedside, medical interpretor  Thank you for allowing the Palliative Medicine Team to assist in the care of this patient.   Total Time 75 Prolonged Time Billed  yes   Greater than 50% of this time was spent counseling and coordinating care related to the above assessment and plan.   Ihor Dow, DNP, FNP-C Palliative Medicine Team  Phone: [phone] Fax: [phone]  Please contact Palliative Medicine Team phone at [phone] for questions and concerns.

## 2020-07-20 NOTE — Consult Note (Signed)
[place] for heparin infusion Indication: atrial fibrillation  Allergies  Allergen Reactions  . Hydrochlorothiazide     Pancreatitis    Patient Measurements: Height: 5' 0.98" (154.9 cm) Weight: 64.1 kg (141 lb 5 oz) IBW/kg (Calculated) : 47.76 Heparin Dosing Weight: 57 kg  Vital Signs: Temp: 99.7 F (37.6 C) (01/12 0018) Temp Source: Temporal (01/12 0018) BP: 125/67 (01/12 0018) Pulse Rate: 89 (01/12 0018)  Labs: Recent Labs    06/27/20 0319 06/27/20 1641 06/28/20 0245 06/28/20 1322 06/28/20 2230 06/29/20 0425 06/29/20 1230 06/29/20 2309  HGB 7.5*  --  7.9*  --   --  7.5*  --   --   HCT 23.5*  --  23.5*  --   --  22.7*  --   --   PLT 214  --  231  --   --  260  --   --   HEPARINUNFRC 0.26*   < > 0.16*   < > 0.17*  --  0.36 0.32  CREATININE 4.37*  --  4.75*  --   --   --   --   --    < > = values in this interval not displayed.    Estimated Creatinine Clearance: 10.4 mL/min (A) (by C-G formula based on SCr of 4.75 mg/dL (H)).   Medical History: Past Medical History:  Diagnosis Date  . Diabetes mellitus without complication ([place])   . 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 Hyperlipidemia   . Hypertension      Assessment: 65 y.o. female admitted with acute hypoxic respiratory failure, AKI on CKD stage IV. Patient with new onset Afib. Pharmacy has been consulted for heparin infusion. Hemoglobin stable ~ 8.0.   1/9 0319 HL 0.26  1/9 1641 HL 0.13, subtherapeutic s/p 900 units bolus x 1 followed by  increased heparin infusion to 900 units/hr- both were given. Given this level was lower than previous HL, will need to ensure heparin wasn't interrupted. Inquired with nursing whether heparin was interrupted and confirmed no line occlusions or interruptions.  01/10 0245 HL 0.16, subtherapeutic 01/10 1322 HL 0.25, subtherapeutic 01/10 2230 HL 0.17 subtherapeutic 01/11 1230 HL 0.36, therapeutic x 1 01/11 2350 HL  0.32, therapeutic x2  Goal of Therapy:  Heparin level 0.3-0.7 units/ml Monitor platelets by anticoagulation protocol: Yes   Plan:  Continue heparin drip at 1550 units/hr. Recheck HL with AM labs. CBC with morning labs.  Renda Rolls, PharmD, [facility] 2020-07-04 12:49 AM

## 2020-07-20 NOTE — Progress Notes (Signed)
Pt was suctioned for a small amount of white secretions. Per the order from Ihor Dow NP palliative care, she was extubated and placed on 4L nasal cannula. This is a one way extubation.

## 2020-07-20 NOTE — Progress Notes (Signed)
  Chaplain On-Call was paged by [facility] of the PACU with report of the death of the patient.  Chaplain went to the Unit and was assisted in meeting and supporting the patient's family by [employer].  Chaplain provided spiritual and emotional support and prayer.  [place] Katelin Kutsch M.Div., [facility]

## 2020-07-20 NOTE — Progress Notes (Signed)
Pt transferred to PACU with RN, RT and  Nurse tech. Report given to [place], Therapist, sports. Lines intact and patent.

## 2020-07-20 DEATH — deceased

## 2020-08-04 LAB — BLOOD GAS, ARTERIAL
Acid-base deficit: 3.7 mmol/L — ABNORMAL HIGH (ref 0.0–2.0)
Bicarbonate: 20.3 mmol/L (ref 20.0–28.0)
FIO2: 1
O2 Saturation: 99.4 %
Patient temperature: 37
pCO2 arterial: 32 mmHg (ref 32.0–48.0)
pH, Arterial: 7.41 (ref 7.350–7.450)
pO2, Arterial: 159 mmHg — ABNORMAL HIGH (ref 83.0–108.0)

## 2022-03-28 IMAGING — DX DG CHEST 1V PORT
1 series · 1 of 1 positions shown · non-contrast
Comparison: Radiograph 06/24/2020

CLINICAL DATA: Endotracheal intubation

EXAM:
PORTABLE CHEST 1 VIEW

[chest ap]
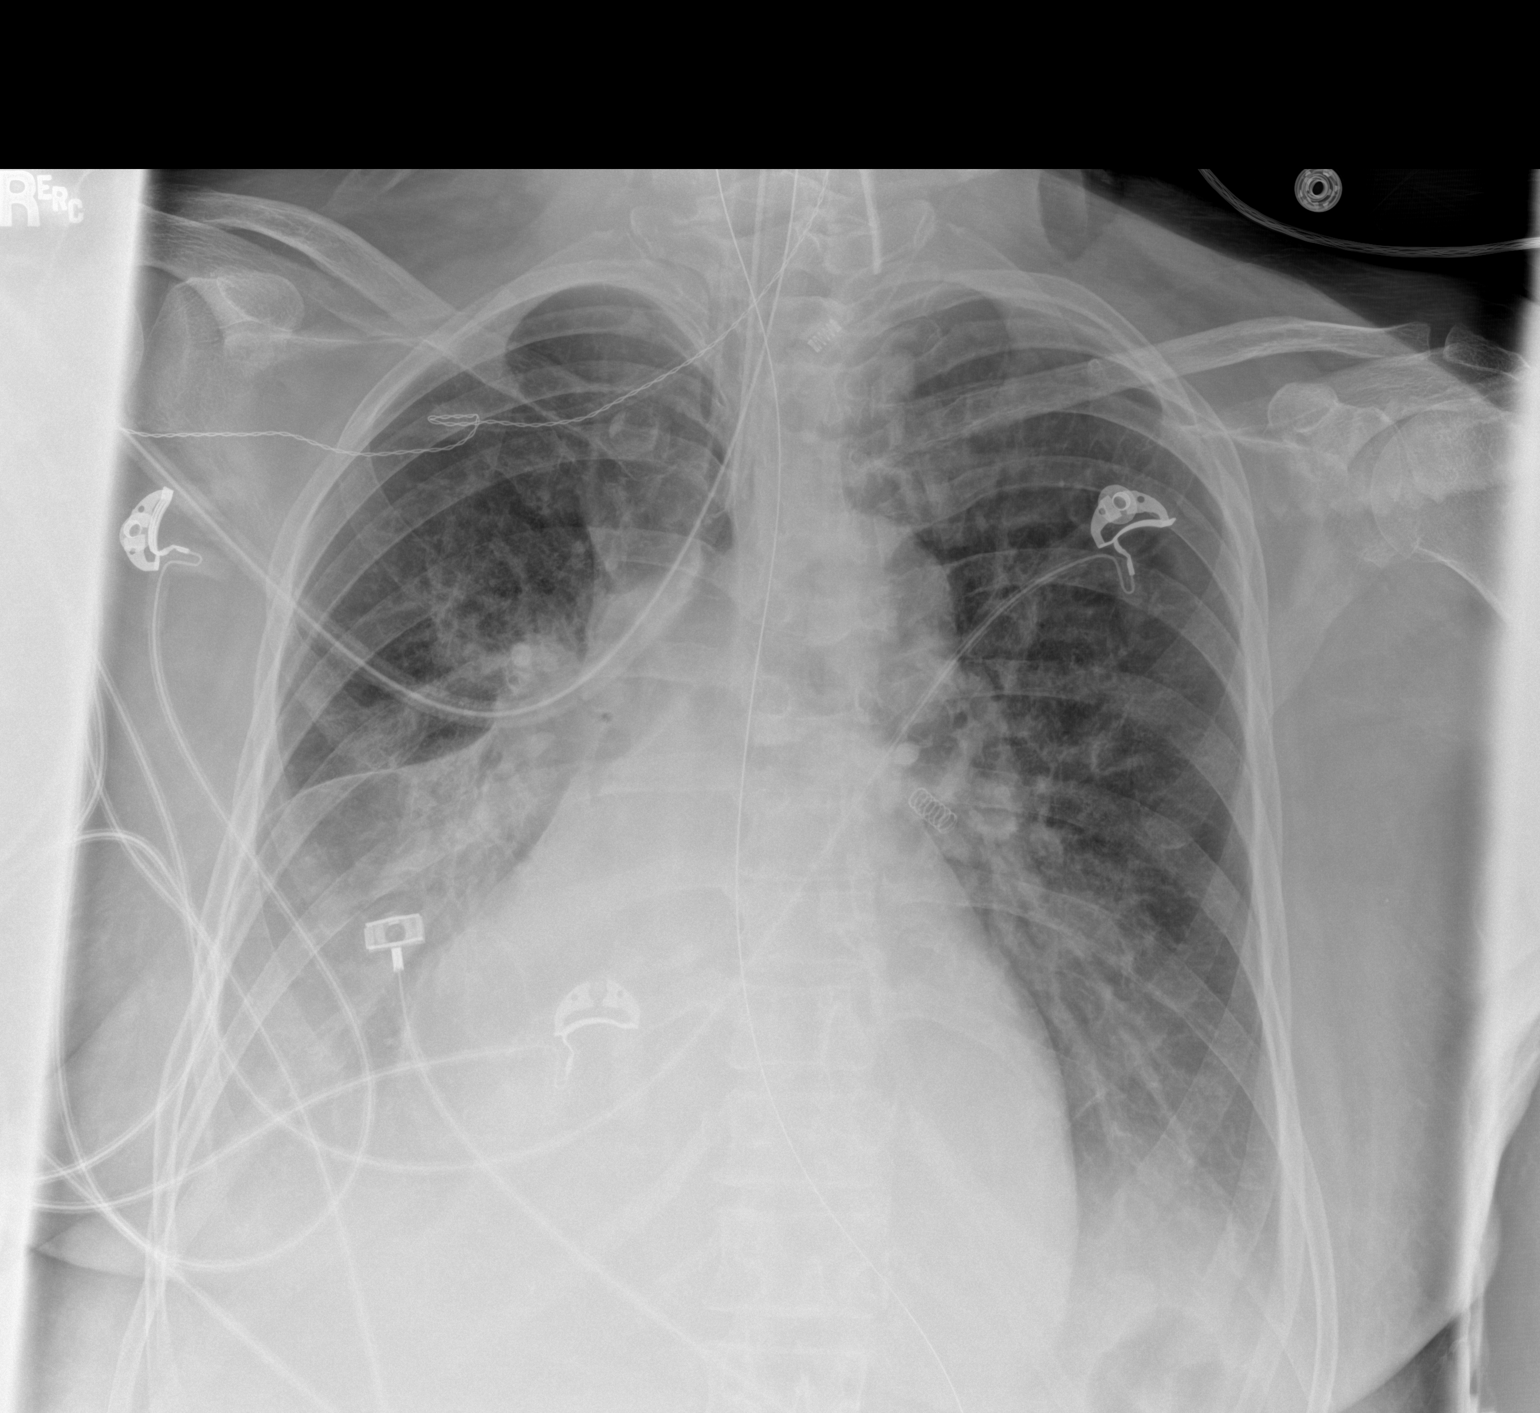

[1 of 1 positions shown; findings below may reference images not displayed]

FINDINGS: Endotracheal tube terminates 3 cm from the carina.

Transesophageal tube tip and side port terminate distal to the GE
junction, beyond the margins of imaging.

Telemetry leads and external support devices overlie the chest.

Diffuse bilateral airspace disease with gradient attenuation in both
lung bases, right greater than left as well as right fissural
thickening suggestive of fluid tracking along the right fissures. No
pneumothorax. Grossly stable stable cardiomediastinal contours
accounting for differences in technique. The aorta is calcified. The
remaining cardiomediastinal contours are unremarkable. No acute
osseous or soft tissue abnormality.
IMPRESSION: Endotracheal tube 3 cm from the carina. Transesophageal tube tip and
side port terminate distal to the GE junction.

Persistent bilateral heterogeneous opacities. Likely increase in the
volume of layering pleural effusions including fluid likely tracking
in the right fissures.
